# Patient Record
Sex: Female | Born: 1965 | Race: White | Hispanic: No | Smoking: Current every day smoker
Health system: Southern US, Academic
[De-identification: ages and names within clinical notes are randomized; demographics above are authoritative.]

## PROBLEM LIST (undated history)

## (undated) DIAGNOSIS — J449 Chronic obstructive pulmonary disease, unspecified: Secondary | ICD-10-CM

## (undated) DIAGNOSIS — F1910 Other psychoactive substance abuse, uncomplicated: Secondary | ICD-10-CM

## (undated) DIAGNOSIS — F1010 Alcohol abuse, uncomplicated: Secondary | ICD-10-CM

## (undated) DIAGNOSIS — K589 Irritable bowel syndrome without diarrhea: Secondary | ICD-10-CM

## (undated) HISTORY — PX: GASTROSCOPY WITH FEEDING TUBE PLACEMENT: WVUENDOPRO64

---

## 2021-07-07 ENCOUNTER — Encounter (HOSPITAL_COMMUNITY): Payer: Self-pay

## 2021-07-07 ENCOUNTER — Inpatient Hospital Stay (HOSPITAL_COMMUNITY)
Admission: EM | Admit: 2021-07-07 | Discharge: 2021-07-10 | DRG: 896 | Disposition: A | Discharge status: Home / Self Care | Payer: Medicaid Other | Attending: Internal Medicine | Admitting: Internal Medicine

## 2021-07-07 ENCOUNTER — Other Ambulatory Visit: Payer: Self-pay

## 2021-07-07 DIAGNOSIS — F17210 Nicotine dependence, cigarettes, uncomplicated: Secondary | ICD-10-CM | POA: Diagnosis present

## 2021-07-07 DIAGNOSIS — F1490 Cocaine use, unspecified, uncomplicated: Secondary | ICD-10-CM | POA: Diagnosis present

## 2021-07-07 DIAGNOSIS — F19129 Other psychoactive substance abuse with intoxication, unspecified: Secondary | ICD-10-CM | POA: Diagnosis present

## 2021-07-07 DIAGNOSIS — F1520 Other stimulant dependence, uncomplicated: Secondary | ICD-10-CM | POA: Diagnosis present

## 2021-07-07 DIAGNOSIS — F11129 Opioid abuse with intoxication, unspecified: Principal | ICD-10-CM | POA: Diagnosis present

## 2021-07-07 DIAGNOSIS — Z20822 Contact with and (suspected) exposure to covid-19: Secondary | ICD-10-CM | POA: Diagnosis present

## 2021-07-07 DIAGNOSIS — I700 Atherosclerosis of aorta: Secondary | ICD-10-CM | POA: Diagnosis present

## 2021-07-07 DIAGNOSIS — J449 Chronic obstructive pulmonary disease, unspecified: Secondary | ICD-10-CM | POA: Diagnosis present

## 2021-07-07 DIAGNOSIS — J9601 Acute respiratory failure with hypoxia: Secondary | ICD-10-CM | POA: Diagnosis present

## 2021-07-07 DIAGNOSIS — J690 Pneumonitis due to inhalation of food and vomit: Secondary | ICD-10-CM | POA: Diagnosis present

## 2021-07-07 DIAGNOSIS — T402X1A Poisoning by other opioids, accidental (unintentional), initial encounter: Secondary | ICD-10-CM | POA: Diagnosis present

## 2021-07-07 DIAGNOSIS — K589 Irritable bowel syndrome without diarrhea: Secondary | ICD-10-CM | POA: Diagnosis present

## 2021-07-07 DIAGNOSIS — T401X1A Poisoning by heroin, accidental (unintentional), initial encounter: Secondary | ICD-10-CM

## 2021-07-07 DIAGNOSIS — I959 Hypotension, unspecified: Secondary | ICD-10-CM | POA: Diagnosis present

## 2021-07-07 DIAGNOSIS — J9690 Respiratory failure, unspecified, unspecified whether with hypoxia or hypercapnia: Secondary | ICD-10-CM | POA: Diagnosis present

## 2021-07-07 HISTORY — DX: Chronic obstructive pulmonary disease, unspecified: J44.9

## 2021-07-07 HISTORY — DX: Other psychoactive substance abuse, uncomplicated: F19.10

## 2021-07-07 HISTORY — DX: Irritable bowel syndrome, unspecified: K58.9

## 2021-07-07 LAB — CBC WITH DIFFERENTIAL/PLATELET
Abs Immature Granulocytes: 0.2 10*3/uL — ABNORMAL HIGH (ref 0.00–0.07)
Basophils Absolute: 0 10*3/uL (ref 0.0–0.1)
Basophils Relative: 0 %
Eosinophils Absolute: 0.1 10*3/uL (ref 0.0–0.5)
Eosinophils Relative: 0 %
HCT: 41.9 % (ref 36.0–46.0)
Hemoglobin: 14.2 g/dL (ref 12.0–15.0)
Immature Granulocytes: 1 %
Lymphocytes Relative: 6 %
Lymphs Abs: 1.3 10*3/uL (ref 0.7–4.0)
MCH: 32.3 pg (ref 26.0–34.0)
MCHC: 33.9 g/dL (ref 30.0–36.0)
MCV: 95.4 fL (ref 80.0–100.0)
Monocytes Absolute: 1.2 10*3/uL — ABNORMAL HIGH (ref 0.1–1.0)
Monocytes Relative: 5 %
Neutro Abs: 19 10*3/uL — ABNORMAL HIGH (ref 1.7–7.7)
Neutrophils Relative %: 88 %
Platelets: 420 10*3/uL — ABNORMAL HIGH (ref 150–400)
RBC: 4.39 MIL/uL (ref 3.87–5.11)
RDW: 13.1 % (ref 11.5–15.5)
WBC: 21.8 10*3/uL — ABNORMAL HIGH (ref 4.0–10.5)
nRBC: 0 % (ref 0.0–0.2)

## 2021-07-07 LAB — COMPREHENSIVE METABOLIC PANEL
ALT: 19 U/L (ref 0–44)
AST: 29 U/L (ref 15–41)
Albumin: 3.3 g/dL — ABNORMAL LOW (ref 3.5–5.0)
Alkaline Phosphatase: 74 U/L (ref 38–126)
Anion gap: 10 (ref 5–15)
BUN: 5 mg/dL — ABNORMAL LOW (ref 6–20)
CO2: 23 mmol/L (ref 22–32)
Calcium: 7.8 mg/dL — ABNORMAL LOW (ref 8.9–10.3)
Chloride: 105 mmol/L (ref 98–111)
Creatinine, Ser: 0.8 mg/dL (ref 0.44–1.00)
GFR, Estimated: >60 mL/min (ref >60)
Glucose, Bld: 87 mg/dL (ref 70–99)
Potassium: 3.6 mmol/L (ref 3.5–5.1)
Sodium: 138 mmol/L (ref 135–145)
Total Bilirubin: 0.6 mg/dL (ref 0.3–1.2)
Total Protein: 6 g/dL — ABNORMAL LOW (ref 6.5–8.1)

## 2021-07-07 LAB — RAPID URINE DRUG SCREEN, HOSP PERFORMED
Amphetamines: POSITIVE — AB
Barbiturates: NOT DETECTED
Benzodiazepines: NOT DETECTED
Cocaine: NOT DETECTED
Opiates: POSITIVE — AB
Tetrahydrocannabinol: NOT DETECTED

## 2021-07-07 LAB — I-STAT BETA HCG BLOOD, ED (MC, WL, AP ONLY): I-stat hCG, quantitative: 5.6 m[IU]/mL — ABNORMAL HIGH (ref <5)

## 2021-07-07 LAB — ETHANOL: Alcohol, Ethyl (B): <10 mg/dL (ref <10)

## 2021-07-07 MED ORDER — NALOXONE HCL 0.4 MG/ML IJ SOLN
INTRAMUSCULAR | Status: AC
  Administered 2021-07-07: 0.2 mg via INTRAVENOUS
  Filled 2021-07-07: qty 1

## 2021-07-07 MED ORDER — NALOXONE HCL 0.4 MG/ML IJ SOLN: 0.2000 mg | Freq: Once | INTRAMUSCULAR | Status: AC

## 2021-07-07 MED ORDER — SODIUM CHLORIDE 0.9 % IV BOLUS
1000.0000 mL | Freq: Once | INTRAVENOUS | Status: AC
  Administered 2021-07-07: 1000 mL via INTRAVENOUS

## 2021-07-07 MED ORDER — NALOXONE HCL 4 MG/0.1ML NA LIQD
1.0000 | Freq: Once | NASAL | Status: AC
  Administered 2021-07-07: 1 via NASAL
  Filled 2021-07-07: qty 4

## 2021-07-07 NOTE — ED Provider Notes (Signed)
[facility] EMERGENCY DEPARTMENT Provider Note   CSN: [id number] Arrival date & time:        History  Chief Complaint  Patient presents with   Drug Overdose    Dasha Kawabata is a 56 y.o. female.  HPI Patient presents via EMS after being found unresponsive at a gas station.  Details obtained by EMS, and police who accompanied patient as well.  Seemingly the patient was found unresponsive, received Narcan, and in a brief period of lucidity acknowledge using heroin.  She became more somnolent in route requiring another dose of Narcan.  On this initial evaluation patient mumbles, but cannot provide any pertinent details of her history.  No report of fall, trauma, violence.  Level 5 caveat secondary to mental status change    Home Medications Prior to Admission medications   Not on File      Allergies    Patient has no known allergies.    Review of Systems   Review of Systems  Unable to perform ROS: Mental status change   Physical Exam Updated Vital Signs BP 122/87 (BP Location: Right Arm)    Pulse 94    Temp 97.7 F (36.5 C) (Axillary) Comment: unable to obtain oral temp at this time   Resp (!) 32    Ht 5' (1.524 m)    Wt 54.4 kg    SpO2 100%    BMI 23.44 kg/m  Physical Exam Vitals and nursing note reviewed.  Constitutional:      Appearance: She is well-developed. She is ill-appearing.     Comments: Thin adult female mumbling, writhing  HENT:     Head: Normocephalic and atraumatic.  Eyes:     Conjunctiva/sclera: Conjunctivae normal.  Cardiovascular:     Rate and Rhythm: Normal rate and regular rhythm.  Pulmonary:     Effort: Pulmonary effort is normal. No respiratory distress.     Breath sounds: Normal breath sounds. No stridor.  Abdominal:     General: There is no distension.  Skin:    General: Skin is warm and dry.  Neurological:     Cranial Nerves: No cranial nerve deficit.     Comments: Moves all extremity spontaneously, does not follow  commands, responds to stimuli, occasionally answers questions appropriately.  Psychiatric:        Mood and Affect: Affect is labile.    ED Results / Procedures / Treatments   Labs (all labs ordered are listed, but only abnormal results are displayed) Labs Reviewed - No data to display  EKG None  Radiology No results found.  Procedures Procedures    Medications Ordered in ED Medications - No data to display  ED Course/ Medical Decision Making/ A&P Consideration of altered mental status secondary to drugs, trauma, medication, electrolyte abnormalities.  Patient placed on continuous monitoring, pulse oximetry.  Cardiac monitor sinus tach, 100, 105 abnormal Pulse ox 99% with room air normal   3:43 PM Patient much more awake, continues to have nasal cannula, but nasal trumpet have been removed.  She is oriented to place, self, roughly to time at this point.                         Medical Decision Making Amount and/or Complexity of Data Reviewed Independent Historian: EMS External Data Reviewed: ECG.    Details: EMS rhythm strips, tachy [phone] Labs: ordered. Decision-making details documented in ED Course. ECG/medicine tests: independent interpretation performed. Decision-making details documented in  ED Course.  Risk Decision regarding hospitalization.  Critical Care Total time providing critical care: < 30 minutes  Adult female presents after apparent overdose.  No evidence for trauma.  Patient required Narcan x2 in the field, monitoring continuously here, had continued improvement, and eventually was awake, alert, able to describe her self as homeless, acknowledged using heroin, snorting, not injecting, no evidence for concurrent infection.  Leukocytosis likely reactive to her unresponsiveness.  Patient discharged with homelessness and substance abuse resources.        Final Clinical Impression(s) / ED Diagnoses Final diagnoses:  Accidental overdose of heroin,  initial encounter [facility])    Rx / DC Orders ED Discharge Orders     None         Gerhard Munch, MD 07/07/21 1656

## 2021-07-07 NOTE — ED Notes (Signed)
Help get patient undressed on the monitor did ekg shown to Dr Jeraldine Loots patient is resting with call bell in reach and nurse at bedside

## 2021-07-07 NOTE — ED Notes (Signed)
Attempted to ambulate pt to restroom. Pt unstable on feet and still requiring 2L [place]. Pt dropped to upper 80s RA. Pt stated "I feel weak". Pt currently on bedpan and has fluids and sandwich at bedside.

## 2021-07-07 NOTE — Discharge Instructions (Addendum)
It was our pleasure to provide your ER care today - we hope that you feel better.  Avoid heroin and/or other drug use. In event of accidental overdose, use narcan immediately as indicated.   Follow up with NA, and use resource guide for additional substance abuse treatment options in the area. Also see guide for social services/shelter.   Follow up with primary care doctor in the next 1-2 weeks.   For mental health issues and/or crisis, you may go directly to the [facility] - they are open 24/7 and walk-ins are welcome.   Return to ER if worse, new symptoms, fevers, chest pain, trouble breathing, or other concern.

## 2021-07-07 NOTE — ED Notes (Signed)
Notified Murphy PA about pt pupils decreasing from 3 to 2 and pt RR 6 and decreased responsiveness.

## 2021-07-07 NOTE — ED Provider Notes (Addendum)
Patient had been signed ot that patient had accidental overdose of heroin, and to d/c after period of ED observation.   Pt with episode of somnolence, decreased resp effort, miosis - narcan given, with rapid return to alert state.   Pt continued to be monitored closely in ED.    Multiple rechecks, pt resting and easily aroused, and/or awake/alert. No subsequent recurrent excessive drowsiness or somnolence, no resp depression.   Recheck pt, denies any new c/o. No headache. No chest pain or sob. Denies feeling depressed. Denies thoughts of self harm, no SI.    Po fluids/food. Ambulate in hall.   Pt encourage to pursue sobriety. Resource guide provided and guide for additional social services/shelters provided. Narcan nasal kit also provided.   Rec pcp and SA tx f/u.  Ambulates in hall to bathroom. Remains fully awake and alert.   Patient current appears stable for d/c.   Return precautions provided.        Lajean Saver, MD 07/07/21 2314

## 2021-07-07 NOTE — ED Notes (Signed)
Pt RR 13 and Alert to speech and pupils 4 and brisk bilaterally

## 2021-07-07 NOTE — ED Triage Notes (Signed)
Pt arrived to ED via EMS from gas station where pt was found unresponsive after a witnessed IV heroin use. Fire gave 1mg  narcan and EMS gave 0.5 mg narcan d/t decreased LOC en route. Pt ventilations were assisted by Fire and EMS. EMS switched pt to NRB. 20g L wrist. VS: BP initially 88/palp, after 367mL fluids, BP was 106/70, HR 104 ST, O2 100% on NRB.

## 2021-07-08 ENCOUNTER — Emergency Department (HOSPITAL_COMMUNITY): Payer: Medicaid Other

## 2021-07-08 DIAGNOSIS — T402X1A Poisoning by other opioids, accidental (unintentional), initial encounter: Secondary | ICD-10-CM | POA: Diagnosis present

## 2021-07-08 DIAGNOSIS — J9690 Respiratory failure, unspecified, unspecified whether with hypoxia or hypercapnia: Secondary | ICD-10-CM | POA: Diagnosis present

## 2021-07-08 DIAGNOSIS — J9601 Acute respiratory failure with hypoxia: Secondary | ICD-10-CM | POA: Diagnosis present

## 2021-07-08 DIAGNOSIS — F1520 Other stimulant dependence, uncomplicated: Secondary | ICD-10-CM | POA: Diagnosis present

## 2021-07-08 LAB — CBC WITH DIFFERENTIAL/PLATELET
Abs Immature Granulocytes: 0.04 10*3/uL (ref 0.00–0.07)
Basophils Absolute: 0 10*3/uL (ref 0.0–0.1)
Basophils Relative: 0 %
Eosinophils Absolute: 0.3 10*3/uL (ref 0.0–0.5)
Eosinophils Relative: 2 %
HCT: 38 % (ref 36.0–46.0)
Hemoglobin: 12.6 g/dL (ref 12.0–15.0)
Immature Granulocytes: 0 %
Lymphocytes Relative: 24 %
Lymphs Abs: 2.7 10*3/uL (ref 0.7–4.0)
MCH: 32.5 pg (ref 26.0–34.0)
MCHC: 33.2 g/dL (ref 30.0–36.0)
MCV: 97.9 fL (ref 80.0–100.0)
Monocytes Absolute: 0.7 10*3/uL (ref 0.1–1.0)
Monocytes Relative: 7 %
Neutro Abs: 7.4 10*3/uL (ref 1.7–7.7)
Neutrophils Relative %: 67 %
Platelets: 378 10*3/uL (ref 150–400)
RBC: 3.88 MIL/uL (ref 3.87–5.11)
RDW: 13.2 % (ref 11.5–15.5)
WBC: 11.1 10*3/uL — ABNORMAL HIGH (ref 4.0–10.5)
nRBC: 0 % (ref 0.0–0.2)

## 2021-07-08 LAB — URINALYSIS, ROUTINE W REFLEX MICROSCOPIC
Bilirubin Urine: NEGATIVE
Glucose, UA: 150 mg/dL — AB
Ketones, ur: NEGATIVE mg/dL
Nitrite: NEGATIVE
Protein, ur: 30 mg/dL — AB
Specific Gravity, Urine: 1.015 (ref 1.005–1.030)
WBC, UA: >50 WBC/hpf — ABNORMAL HIGH (ref 0–5)
pH: 5 (ref 5.0–8.0)

## 2021-07-08 LAB — RESP PANEL BY RT-PCR (FLU A&B, COVID) ARPGX2
Influenza A by PCR: NEGATIVE
Influenza B by PCR: NEGATIVE
SARS Coronavirus 2 by RT PCR: NEGATIVE

## 2021-07-08 LAB — HIV ANTIBODY (ROUTINE TESTING W REFLEX): HIV Screen 4th Generation wRfx: NONREACTIVE

## 2021-07-08 LAB — COMPREHENSIVE METABOLIC PANEL
ALT: 15 U/L (ref 0–44)
AST: 22 U/L (ref 15–41)
Albumin: 3 g/dL — ABNORMAL LOW (ref 3.5–5.0)
Alkaline Phosphatase: 77 U/L (ref 38–126)
Anion gap: 4 — ABNORMAL LOW (ref 5–15)
BUN: 7 mg/dL (ref 6–20)
CO2: 27 mmol/L (ref 22–32)
Calcium: 7.9 mg/dL — ABNORMAL LOW (ref 8.9–10.3)
Chloride: 104 mmol/L (ref 98–111)
Creatinine, Ser: 0.73 mg/dL (ref 0.44–1.00)
GFR, Estimated: >60 mL/min (ref >60)
Glucose, Bld: 98 mg/dL (ref 70–99)
Potassium: 3.5 mmol/L (ref 3.5–5.1)
Sodium: 135 mmol/L (ref 135–145)
Total Bilirubin: 0.1 mg/dL — ABNORMAL LOW (ref 0.3–1.2)
Total Protein: 5.6 g/dL — ABNORMAL LOW (ref 6.5–8.1)

## 2021-07-08 LAB — PHOSPHORUS: Phosphorus: 3 mg/dL (ref 2.5–4.6)

## 2021-07-08 LAB — LACTIC ACID, PLASMA: Lactic Acid, Venous: 1.3 mmol/L (ref 0.5–1.9)

## 2021-07-08 LAB — MAGNESIUM: Magnesium: 1.9 mg/dL (ref 1.7–2.4)

## 2021-07-08 MED ORDER — SODIUM CHLORIDE 0.9 % IV SOLN: INTRAVENOUS | Status: AC

## 2021-07-08 MED ORDER — POLYETHYLENE GLYCOL 3350 17 G PO PACK: 17.0000 g | PACK | Freq: Every day | ORAL | Status: DC | PRN

## 2021-07-08 MED ORDER — ENOXAPARIN SODIUM 40 MG/0.4ML IJ SOSY
40.0000 mg | PREFILLED_SYRINGE | INTRAMUSCULAR | Status: DC
  Administered 2021-07-08 – 2021-07-10 (×3): 40 mg via SUBCUTANEOUS
  Filled 2021-07-08 (×3): qty 0.4

## 2021-07-08 MED ORDER — SODIUM CHLORIDE 0.9% FLUSH
3.0000 mL | Freq: Two times a day (BID) | INTRAVENOUS | Status: DC
  Administered 2021-07-08 – 2021-07-10 (×4): 3 mL via INTRAVENOUS

## 2021-07-08 MED ORDER — SODIUM CHLORIDE 0.9 % IV BOLUS
1000.0000 mL | Freq: Once | INTRAVENOUS | Status: AC
  Administered 2021-07-08: 1000 mL via INTRAVENOUS

## 2021-07-08 MED ORDER — ACETAMINOPHEN 650 MG RE SUPP: 650.0000 mg | Freq: Four times a day (QID) | RECTAL | Status: DC | PRN

## 2021-07-08 MED ORDER — CEFTRIAXONE SODIUM 2 G IJ SOLR
2.0000 g | Freq: Once | INTRAMUSCULAR | Status: AC
Start: 2021-07-08 — End: 2021-07-08
  Administered 2021-07-08: 2 g via INTRAVENOUS
  Filled 2021-07-08: qty 20

## 2021-07-08 MED ORDER — ACETAMINOPHEN 325 MG PO TABS: 650.0000 mg | ORAL_TABLET | Freq: Four times a day (QID) | ORAL | Status: DC | PRN

## 2021-07-08 MED ORDER — SODIUM CHLORIDE 0.9 % IV SOLN
500.0000 mg | Freq: Once | INTRAVENOUS | Status: AC
  Administered 2021-07-08: 500 mg via INTRAVENOUS
  Filled 2021-07-08: qty 5

## 2021-07-08 NOTE — ED Notes (Signed)
Pt was ambulated to bathroom w/o O2 unbeknownst to this RN until after the fact. Assisted pt back to bed and pt was 87% RA and c/o shob. Pt hooked up to 2L O2 via [place] and sats increased to 98%. Pt also reporting dizziness when ambulating. Instructed pt to not let anyone ambulate her to the bathroom d/t shob, O2 requirement and dizziness, and low BP.

## 2021-07-08 NOTE — Sepsis Progress Note (Signed)
eLink is following this Code Sepsis. °

## 2021-07-08 NOTE — ED Notes (Signed)
Paged admitting regarding urinary retention of per bladder scan. Pt has been attempting to void w/o much success. Awaiting response from MD

## 2021-07-08 NOTE — ED Notes (Signed)
Pt more alert than yesterday. Pt is still disoriented to situation. Pt was oriented to time and person initially yesterday. She can now tell me she is in the hospital. Pt remains hypotensive, however pt reports her baseline SBP is in the 90's. Pt still requiring 2L O2 which is not her baseline. Notifying MD.

## 2021-07-08 NOTE — ED Notes (Signed)
IV infiltrated. Paused infusions currently running to place another IV

## 2021-07-08 NOTE — ED Notes (Signed)
This RN attempted to wean pt off of O2, sats began to drop in mid 80s. This RN placed pt back on 2L. This RN notified charge RN that this pt was not appropriate to be discharged at this time.

## 2021-07-08 NOTE — ED Notes (Signed)
Bladder scan shows 

## 2021-07-08 NOTE — H&P (Signed)
Date: 07/08/2021     Patient Name:  Lauren Blackwell MRN: [id number]  DOB: 12-04-1965 Age / Sex: 56 y.o., female   PCP: Pcp, No         Medical Service: Internal Medicine Teaching Service       Attending Physician: Dr. Mikey Bussing, Marthenia Rolling, DO    Intern (1st Contact): Elza Rafter, DO Pager: RA [phone]  Resident (2nd Contact): Salena Saner, MD Pager: PB [phone]       After Hours (After 5p/  First Contact Pager: [phone]  weekends / holidays): Second Contact Pager: [phone]   SUBJECTIVE:  Chief Complaint: Respiratory distress  History of Present Illness: Lauren Blackwell is a functionally independent 56 y.o. female with a pertinent PMH of polysubstance use disorder, who presents to [facility] with AMS and shortness of breath.  Patient presents with altered mental status secondary to drug intoxication that improved with Narcan.  During this time patient had developed some shortness of breath with mild desaturations with SPO2 into the low 90s.  Patient endorsed IV drug use without any signs or symptoms of systemic infection.  She denies any fevers, chills,'s shortness of breath, chest pain, open wounds, sick contacts.  Her last use was yesterday and endorsed IV heroin use although she prefers methamphetamines on the weekly basis.   Medications: No current facility-administered medications on file prior to encounter.   Current Outpatient Medications on File Prior to Encounter  Medication Sig Dispense Refill   acetaminophen (TYLENOL) 500 MG tablet Take 1,000 mg by mouth every 6 (six) hours as needed for moderate pain or headache.      Past Medical History: Past Medical History:  Diagnosis Date   COPD (chronic obstructive pulmonary disease) (HCC)    IBS (irritable bowel syndrome)    IV drug abuse [facility])    Heroin    Social:  Lives - [address] [place],  [place] [address],   Support -unknown Level of function: Independent  Occupation -unknown PCP - Pcp, No,  Substance use: Illicit -  amphetamines and other , heroin ETOH -  unknown Tobacco -yes  Family History: History reviewed. No pertinent family history.  Allergies: Allergies as of 07/07/2021   (No Known Allergies)    Review of Systems: A complete ROS was negative except as per HPI.   OBJECTIVE:  Physical Exam: Blood pressure 93/67, pulse 83, temperature 97.7 F (36.5 C), temperature source Axillary, resp. rate 10, height 5' (1.524 m), weight 54.4 kg, SpO2 91 %. Physical Exam Constitutional:      Appearance: Normal appearance. She is underweight. She is ill-appearing (chronically ill appearing).  HENT:     Head: Normocephalic and atraumatic.     Mouth/Throat:     Comments: Poor dentition  Eyes:     Extraocular Movements: Extraocular movements intact.  Cardiovascular:     Rate and Rhythm: Normal rate.     Pulses: Normal pulses.     Heart sounds: Normal heart sounds.  Pulmonary:     Effort: Pulmonary effort is normal.     Breath sounds: Normal breath sounds.  Abdominal:     General: Bowel sounds are normal.     Palpations: Abdomen is soft.     Tenderness: There is no abdominal tenderness.  Musculoskeletal:        General: Normal range of motion.     Cervical back: Normal range of motion.     Right lower leg: No edema.     Left lower leg: No edema.  Skin:  General: Skin is warm and dry.  Neurological:     Mental Status: She is alert and oriented to person, place, and time. Mental status is at baseline.  Psychiatric:        Mood and Affect: Mood normal.    Pertinent Labs: CBC    Component Value Date/Time   WBC 11.1 (H) 07/08/2021 1324   RBC 3.88 07/08/2021 1324   HGB 12.6 07/08/2021 1324   HCT 38.0 07/08/2021 1324   PLT 378 07/08/2021 1324   MCV 97.9 07/08/2021 1324   MCH 32.5 07/08/2021 1324   MCHC 33.2 07/08/2021 1324   RDW 13.2 07/08/2021 1324   LYMPHSABS 2.7 07/08/2021 1324   MONOABS 0.7 07/08/2021 1324   EOSABS 0.3 07/08/2021 1324   BASOSABS 0.0 07/08/2021 1324      CMP     Component Value Date/Time   NA 135 07/08/2021 1324   K 3.5 07/08/2021 1324   CL 104 07/08/2021 1324   CO2 27 07/08/2021 1324   GLUCOSE 98 07/08/2021 1324   BUN 7 07/08/2021 1324   CREATININE 0.73 07/08/2021 1324   CALCIUM 7.9 (L) 07/08/2021 1324   PROT 5.6 (L) 07/08/2021 1324   ALBUMIN 3.0 (L) 07/08/2021 1324   AST 22 07/08/2021 1324   ALT 15 07/08/2021 1324   ALKPHOS 77 07/08/2021 1324   BILITOT 0.1 (L) 07/08/2021 1324   GFRNONAA >60 07/08/2021 1324    Pertinent Imaging: DG Chest Port 1 View  Result Date: 07/08/2021 CLINICAL DATA:  Found unresponsive at gas station following witnessed IV heroin use, history COPD, smoker EXAM: PORTABLE CHEST 1 VIEW COMPARISON:  Portable exam 0812 hours without priors for comparison FINDINGS: Normal heart size, mediastinal contours, and pulmonary vascularity. Atherosclerotic calcification aorta. Bibasilar atelectasis. No definite infiltrate, pleural effusion, or pneumothorax. Multiple old RIGHT rib fractures without acute osseous findings. IMPRESSION: Bibasilar atelectasis. Aortic Atherosclerosis (ICD10-I70.0). Electronically Signed   By: Ulyses Southward M.D.   On: 07/08/2021 08:21    EKG: sinus tachycardia  ASSESSMENT & PLAN:  Patient Summary: Lauren Blackwell is a functionally independent 56 y.o. female with a pertinent PMH of polysubstance use disorder, who presents to [facility] with AMS and shortness of breath.and admit for acute heroine intoxication and likely aspiration.   Assessment: Principal Problem:   Respiratory failure (HCC)  Plan: #Heroin intoxication Patient has a history of IV drug use who presented with acute heroin intoxication that resolved with Narcan.  Patient states she does not usually use opioid medications.  She has no significant systemic signs of infection.  Due to her history we will empirically treat for infection today and tomorrow. -Blood cultures -Broad-spectrum antibiotics -Likely discharge  tomorrow  #Aspiration Patient likely developed mild respiratory distress with aspiration after acute heroin intoxication.  X-ray shows some bibasilar atelectasis without signs of infection. -She is currently off supplemental oxygen at this time.  Best Practice: Diet: Regular diet IVF: D5-0.9NS,  VTE: enoxaparin (LOVENOX) injection 40 mg Start: 07/08/21 1145 Code: Full Code Dispo: Observation with expected length of stay less than 2 midnights. Anticipated Discharge Location: Home Barriers to Discharge: IV antibiotics Family Contact: none  Signature: Chari Manning, D.O.  Internal Medicine Resident, PGY-3 [facility]  Pager: [phone] 4:11 PM, 07/08/2021   Please contact the on call pager after 5 pm and on weekends at [phone].

## 2021-07-08 NOTE — ED Provider Notes (Signed)
I assumed care of the patient at 0 800.  Briefly the patient had presented after a heroin overdose.  She was observed in the emergency department and deemed okay for discharge.  Nursing had noted that she was hypoxic and persistently confused and so she was kept in her room overnight.  She does endorse having some difficulty breathing.  Has been coughing and tells me she has been coughing up some gross sputum.  She was also noted to be hypotensive.  We will give a bolus of IV fluids.  I suspect the most likely diagnosis post heroin overdose with hypoxia would be aspiration pneumonia.  As the patient is hypotensive and hypoxic we will initiate code sepsis.  Started on antibiotics.  Blood cultures.  I do not hear any obvious murmur on my exam.  She does have faint right lower lobe rhonchi compared to the left consistent with aspiration.  Chest x-ray viewed by me without obvious signs of consolidation.  Patient reassessed and blood pressures come up a little bit with fluids.  Maps in the 70s.  Will discuss with medicine for admission.  Sepsis - Repeat Assessment  Performed at:    1035  Vitals     Blood pressure 93/65, pulse 69, temperature 97.7 F (36.5 C), temperature source Axillary, resp. rate 15, height 5' (1.524 m), weight 54.4 kg, SpO2 93 %.  Heart:     Regular rate and rhythm  Lungs:    Rhonchi  Capillary Refill:   <2 sec  Peripheral Pulse:   Radial pulse palpable  Skin:     Normal Color     CRITICAL CARE Performed by: Rae Roam   Total critical care time: 35 minutes  Critical care time was exclusive of separately billable procedures and treating other patients.  Critical care was necessary to treat or prevent imminent or life-threatening deterioration.  Critical care was time spent personally by me on the following activities: development of treatment plan with patient and/or surrogate as well as nursing, discussions with consultants, evaluation of patient's  response to treatment, examination of patient, obtaining history from patient or surrogate, ordering and performing treatments and interventions, ordering and review of laboratory studies, ordering and review of radiographic studies, pulse oximetry and re-evaluation of patient's condition.    Melene Plan, DO 07/08/21 1035

## 2021-07-08 NOTE — ED Notes (Signed)
Called lab to have them draw labs and cultures.

## 2021-07-08 NOTE — ED Notes (Signed)
Attempted to wean pt off of O2, O2 sats 84% RA, this RN placed pt back 2L [place].

## 2021-07-08 NOTE — ED Notes (Signed)
Paged attending DO regarding need for foley order d/t urinary retention w/ bladder scan volume of . Still awaiting response.

## 2021-07-08 NOTE — ED Notes (Signed)
Ordered breakfast tray for pt  

## 2021-07-08 NOTE — ED Notes (Signed)
Per MD I&O cath x 1 and then see if pt can void on her own, then bladder scan again in a few hours after I&O cath. If still retaining, contact MD back. Expressed concern for this placing pt at a higher risk for infection d/t multiple straight caths and pt being worked up for sepsis and hx of IV drug use. MD verbalized awareness of the increased risk of infection.

## 2021-07-08 NOTE — ED Notes (Signed)
Assisted patient with peri-care; provided new gown and clean linens prior to transport to 5W.

## 2021-07-08 NOTE — ED Notes (Signed)
Pt ambulated to bathroom x1 assist. This RN reassessed VS once back in bed. While attempting to wean pt off of O2, pt's sats 80% RA. Pt placed back on 2L [place]. EDP notified about pt's O2 needs.

## 2021-07-09 DIAGNOSIS — F1510 Other stimulant abuse, uncomplicated: Secondary | ICD-10-CM

## 2021-07-09 DIAGNOSIS — J9601 Acute respiratory failure with hypoxia: Secondary | ICD-10-CM

## 2021-07-09 DIAGNOSIS — F17210 Nicotine dependence, cigarettes, uncomplicated: Secondary | ICD-10-CM

## 2021-07-09 LAB — URINE CULTURE

## 2021-07-09 LAB — HEPATITIS C ANTIBODY: HCV Ab: NONREACTIVE

## 2021-07-09 MED ORDER — IPRATROPIUM-ALBUTEROL 0.5-2.5 (3) MG/3ML IN SOLN: 3.0000 mL | RESPIRATORY_TRACT | Status: DC | PRN

## 2021-07-09 MED ORDER — IPRATROPIUM-ALBUTEROL 0.5-2.5 (3) MG/3ML IN SOLN
3.0000 mL | RESPIRATORY_TRACT | Status: DC
  Administered 2021-07-09: 3 mL via RESPIRATORY_TRACT
  Filled 2021-07-09 (×3): qty 3

## 2021-07-09 MED ORDER — AMOXICILLIN-POT CLAVULANATE 875-125 MG PO TABS
1.0000 | ORAL_TABLET | Freq: Two times a day (BID) | ORAL | Status: DC
  Administered 2021-07-09 – 2021-07-10 (×3): 1 via ORAL
  Filled 2021-07-09: qty 7
  Filled 2021-07-09 (×3): qty 1

## 2021-07-09 MED ORDER — NICOTINE 14 MG/24HR TD PT24
14.0000 mg | MEDICATED_PATCH | Freq: Every day | TRANSDERMAL | Status: DC
  Administered 2021-07-09 – 2021-07-10 (×2): 14 mg via TRANSDERMAL
  Filled 2021-07-09 (×2): qty 1

## 2021-07-09 NOTE — Plan of Care (Signed)

## 2021-07-09 NOTE — Progress Notes (Addendum)
Lauren Blackwell is a un-homed 56 y.o. female with a pertinent PMH of polysubstance use disorder, COPD who presented to [facility] with AMS and shortness of breath and admit for acute heroine intoxication and likely aspiration pneumonia with initial CBC showing leukocytosis with neutrophil predominance.  Subjective:  Overnight:NAEON  Patient stated she was doing well except for a headache. She thought the headache was coming from the oxygen. She stated she does not have a place to live after leaving the hospital. Assured patient we will get someone who will assist the patient with this.    Objective:  Vital signs in last 24 hours: afebrile with one temp at 99.4, normotensive, desaturation to 80s and placed on 3 L. Vitals:   07/09/21 0345 07/09/21 0455 07/09/21 0529 07/09/21 0754  BP: 105/75   (!) 117/91  Pulse: 97   (!) 111  Resp: 14   19  Temp: 98.3 F (36.8 C)   98.6 F (37 C)  TempSrc: Axillary   Oral  SpO2: 91% (!) 80% (!) 83% 96%  Weight:      Height:       Supplemental O2: 3 L SpO2: 96 % O2 Flow Rate (L/min): 3 L/min  Filed Weights   07/07/21 1430 07/09/21 0008  Weight: 54.4 kg 51.5 kg     Intake/Output Summary (Last 24 hours) at 07/09/2021 0858 Last data filed at 07/09/2021 0536 Gross per 24 hour  Intake 2844.75 ml  Output 1450 ml  Net 1394.75 ml   Net IO Since Admission: 2,695.27 mL [07/09/21 0858]  Physical Exam General: NAD CV: normal heart sounds, 2+ radial pulses Pulm: Normal work of breathing, chest clear to auscultation of left lung and decreased movement on the right lower lung. No wheeze heard.  MSK: Normal muscle bulk and tone.  Neuro: Alert and oriented x4.  Psych: Normal mood and normal affect.   Diagnostics: CBC Latest Ref Rng & Units 07/08/2021 07/07/2021  WBC 4.0 - 10.5 K/uL 11.1(H) 21.8(H)  Hemoglobin 12.0 - 15.0 g/dL 12.6 14.2  Hematocrit 36.0 - 46.0 % 38.0 41.9  Platelets 150 - 400 K/uL 378 420(H)    BMP Latest Ref Rng & Units 07/08/2021 07/07/2021   Glucose 70 - 99 mg/dL 98 87  BUN 6 - 20 mg/dL 7 5(L)  Creatinine 0.44 - 1.00 mg/dL 0.73 0.80  Sodium 135 - 145 mmol/L 135 138  Potassium 3.5 - 5.1 mmol/L 3.5 3.6  Chloride 98 - 111 mmol/L 104 105  CO2 22 - 32 mmol/L 27 23  Calcium 8.9 - 10.3 mg/dL 7.9(L) 7.8(L)    Assessment/Plan: Lauren Blackwell is a un-homed 56 y.o. female with a pertinent PMH of polysubstance use disorder, COPD who presented to [facility] with AMS and shortness of breath and admit for acute heroine intoxication and likely aspiration pneumonia with initial CBC showing leukocytosis with neutrophil predominance.   Principal Problem:   Opioid overdose ([place]) Active Problems:   Acute hypoxemic respiratory failure (HCC)   Methamphetamine use disorder, severe, dependence (HCC)   Respiratory failure ([place])  Aspiration Pneumonia Acute hypoxic respiratory failure Patient is s/p Azithromycin and Rocephin in the ED. Leukocytosis count improved with administration of these abx. Patient satting in the 80s overnight and placed on 3 L. Given hypoxia, CXR findings and leukocytosis, aspiration pneumonia appears as likely cause. Will switch to Augmentin 875-125 BID for 4 days to complete 5 day abx course. Blood cx are NGTD. -Augmentin as above (day 2/5) -Supplemental oxygen as needed for oxygen saturation between 88-92 %.  Wean as tolerated. -Duo-nebs q 4hrs   Heroin Intoxication (resolved) Methamphetamine use disorder UDS positive for opioids and amphetamines. Patient is s/p narcan with resolution of her lethargy and alteration of mental status. Patient endorsed long term hx with of cocaine and amphetamine use but stated does not use opiates regularly. Endorsed desire to quit and when offered a treatment center as a potential option she stated she would like that.  -TOC consult for assistance with substance abuse resources and housing assistance -CTM for withdrawal symptoms  COPD Patient states she has been told she has COPD previously and  was placed on 2 inhalers previously. She endorsed 20 pack year smoking hx. No wheezing was appreciated. She did have poor air movement in the right lung which appears to be 2/2 to her aspiration pneumonia. No recent record of inhaler use or PFTs. So will use duo-nebs while in the hospital and plan for further evaluation on outpatient setting.  -Will start duo-nebs q4hrs  -Outpatient PFTs to restart inhalers if needed  Tobacco use disorder 20 pack smoking hx. Counseled on smoking cessation. -Nicotine patch daily  Diet: Regular diet IVF: None VTE: enoxaparin (LOVENOX) injection 40 mg Code: Full Code Dispo: Observation with expected length of stay less than 2 midnights. Anticipated Discharge Location: Home Barriers to Discharge: IV antibiotics Family Contact: none  Dispo: Anticipated discharge in approximately 1 day(s).   Lauren Schuller, MD Tillie Rung. [facility] Internal Medicine Residency, PGY-1  Pager: [phone] After 5 pm and on weekends: Please call the on-call pager

## 2021-07-09 NOTE — Progress Notes (Signed)
Pt received from ED in stable condition. Pt A/Ox4, able to follow commands but drowsy. Pt oriented to room, CHG bath and skin assessment completed.

## 2021-07-09 NOTE — Progress Notes (Signed)
SATURATION QUALIFICATIONS: (This note is used to comply with regulatory documentation for home oxygen)  Patient Saturations on Room Air at Rest = 86%  Patient Saturations on Room Air while Ambulating = 82%  Patient Saturations on 3 Liters of oxygen while Ambulating = 90%  Please briefly explain why patient needs home oxygen: 

## 2021-07-10 ENCOUNTER — Encounter (HOSPITAL_COMMUNITY): Payer: Self-pay

## 2021-07-10 ENCOUNTER — Emergency Department (HOSPITAL_COMMUNITY)
Admission: EM | Admit: 2021-07-10 | Discharge: 2021-07-11 | Disposition: A | Discharge status: Home / Self Care | Payer: Medicaid Other | Attending: Emergency Medicine | Admitting: Emergency Medicine

## 2021-07-10 DIAGNOSIS — J440 Chronic obstructive pulmonary disease with acute lower respiratory infection: Secondary | ICD-10-CM

## 2021-07-10 DIAGNOSIS — R0602 Shortness of breath: Secondary | ICD-10-CM | POA: Insufficient documentation

## 2021-07-10 DIAGNOSIS — I700 Atherosclerosis of aorta: Secondary | ICD-10-CM

## 2021-07-10 DIAGNOSIS — T402X1A Poisoning by other opioids, accidental (unintentional), initial encounter: Secondary | ICD-10-CM

## 2021-07-10 DIAGNOSIS — R059 Cough, unspecified: Secondary | ICD-10-CM | POA: Insufficient documentation

## 2021-07-10 DIAGNOSIS — J690 Pneumonitis due to inhalation of food and vomit: Secondary | ICD-10-CM

## 2021-07-10 DIAGNOSIS — J449 Chronic obstructive pulmonary disease, unspecified: Secondary | ICD-10-CM | POA: Insufficient documentation

## 2021-07-10 LAB — BASIC METABOLIC PANEL
Anion gap: 6 (ref 5–15)
BUN: 8 mg/dL (ref 6–20)
CO2: 27 mmol/L (ref 22–32)
Calcium: 8.4 mg/dL — ABNORMAL LOW (ref 8.9–10.3)
Chloride: 105 mmol/L (ref 98–111)
Creatinine, Ser: 0.67 mg/dL (ref 0.44–1.00)
GFR, Estimated: >60 mL/min (ref >60)
Glucose, Bld: 117 mg/dL — ABNORMAL HIGH (ref 70–99)
Potassium: 3.4 mmol/L — ABNORMAL LOW (ref 3.5–5.1)
Sodium: 138 mmol/L (ref 135–145)

## 2021-07-10 LAB — CBC WITH DIFFERENTIAL/PLATELET
Abs Immature Granulocytes: 0.02 10*3/uL (ref 0.00–0.07)
Basophils Absolute: 0 10*3/uL (ref 0.0–0.1)
Basophils Relative: 0 %
Eosinophils Absolute: 0.2 10*3/uL (ref 0.0–0.5)
Eosinophils Relative: 2 %
HCT: 38.2 % (ref 36.0–46.0)
Hemoglobin: 12.8 g/dL (ref 12.0–15.0)
Immature Granulocytes: 0 %
Lymphocytes Relative: 27 %
Lymphs Abs: 2.4 10*3/uL (ref 0.7–4.0)
MCH: 32 pg (ref 26.0–34.0)
MCHC: 33.5 g/dL (ref 30.0–36.0)
MCV: 95.5 fL (ref 80.0–100.0)
Monocytes Absolute: 0.6 10*3/uL (ref 0.1–1.0)
Monocytes Relative: 7 %
Neutro Abs: 5.5 10*3/uL (ref 1.7–7.7)
Neutrophils Relative %: 64 %
Platelets: 400 10*3/uL (ref 150–400)
RBC: 4 MIL/uL (ref 3.87–5.11)
RDW: 12.8 % (ref 11.5–15.5)
WBC: 8.8 10*3/uL (ref 4.0–10.5)
nRBC: 0 % (ref 0.0–0.2)

## 2021-07-10 MED ORDER — AMOXICILLIN-POT CLAVULANATE 875-125 MG PO TABS: 1.0000 | ORAL_TABLET | Freq: Two times a day (BID) | ORAL | 0 refills | Status: DC

## 2021-07-10 MED ORDER — AMOXICILLIN-POT CLAVULANATE 875-125 MG PO TABS: 1.0000 | ORAL_TABLET | Freq: Two times a day (BID) | ORAL | 0 refills | Status: AC

## 2021-07-10 NOTE — Discharge Summary (Signed)
Name: Lauren Blackwell MRN: [id number] DOB: 1965-07-13 56 y.o. PCP: Pcp, No  Date of Admission: 07/07/2021  2:08 PM Date of Discharge: 07/10/2021 Attending Physician: Lucious Groves, DO  Discharge Diagnosis: 1. Heroin intoxication in setting of polysubstance use disorder 2. Aspiration pneumonia 3. COPD 4. Aortic atherosclerosis  Discharge Medications: Allergies as of 07/10/2021   No Known Allergies      Medication List     TAKE these medications    acetaminophen 500 MG tablet Commonly known as: TYLENOL Take 1,000 mg by mouth every 6 (six) hours as needed for moderate pain or headache.   amoxicillin-clavulanate 875-125 MG tablet Commonly known as: AUGMENTIN Take 1 tablet by mouth every 12 (twelve) hours for 3 days.       Disposition and follow-up:   Ms.Trinidee Shirk was discharged from [facility] in Stable condition.  At the hospital follow up visit please address:  1. Heroin intoxication in setting of polysubstance use disorder: Social work during hospitalization gave pt resources. If continues to use opioids would recommend suboxone clinic.  2. Aspiration pneumonia: Patient to complete 5 days of antibiotics, last day 07/12/2021.  3. COPD: Would benefit from PFT's, possibly maintenance inhaler  4. Aortic atherosclerosis: Would benefit from risk factor modification work-up.  5.  Labs / imaging needed at time of follow-up: CBC, BMP  6.  Pending labs/ test needing follow-up: n/a  Follow-up Appointments:  Follow-up Information     [facility].   Contact information: [place] [phone] [place].   Specialty: Urgent Care Contact information: [place] [facility] Course by problem list: 1. Heroin intoxication in setting of polysubstance use disorder Patient arrived to  ED via EMS after being found unresponsive following witnessed IV heroin use. She reports she thought the substance was methamphetamines. Patient required two doses of Narcan. Initially patient remained somnolent but eventually became awake and alert. Patient did not show signs of opioid withdrawal during admission. We engaged CSW and TOC early in the admission to assist with transportation, housing, and PCP needs.  2. Aspiration pneumonia While in the ED, patient was persistently hypoxic to 80's with mild hypotension and productive cough. She was initiated on IV antibiotics and IMTS was consulted for admission. Given clinical picture and chest x-ray findings presentation most consistent with aspiration pneumonia. Patient was able to quickly wean off supplemental oxygen and was transitioned over to oral antibiotics on day 1. She was hemodynamically stable and sating well on room air the next day. She was then discharged on Augmentin with instructions to complete a total of 5 day course.   Discharge Exam:   BP 121/77 (BP Location: Left Arm)    Pulse 83    Temp 97.9 F (36.6 C) (Oral)    Resp 16    Ht 5' (1.524 m)    Wt 51.5 kg    SpO2 92%    BMI 22.17 kg/m  Discharge exam:  General: Resting comfortably in bed, no acute distress CV: Regular rate, rhythm. No murmurs appreciated Pulm: Normal work of breathing on room air. Clear to ausculation bilaterally GI: Abdomen soft, non-tender, non-distended. Normoactive bowel sounds MSK: Normal bulk, tone. No pitting edema bilateral lower extremities Neuro: Awake, alert, conversing appropriately Psych: Normal mood, affect, speech  Pertinent Labs,  Studies, and Procedures:  CBC Latest Ref Rng & Units 07/08/2021 07/07/2021  WBC 4.0 - 10.5 K/uL 11.1(H) 21.8(H)  Hemoglobin 12.0 - 15.0 g/dL 12.6 14.2  Hematocrit 36.0 - 46.0 % 38.0 41.9  Platelets 150 - 400 K/uL 378 420(H)   BMP Latest Ref Rng & Units 07/08/2021 07/07/2021  Glucose 70 - 99 mg/dL 98 87  BUN 6 - 20  mg/dL 7 5(L)  Creatinine 0.44 - 1.00 mg/dL 0.73 0.80  Sodium 135 - 145 mmol/L 135 138  Potassium 3.5 - 5.1 mmol/L 3.5 3.6  Chloride 98 - 111 mmol/L 104 105  CO2 22 - 32 mmol/L 27 23  Calcium 8.9 - 10.3 mg/dL 7.9(L) 7.8(L)   CXR 07/08/2021: Bibasilar atelectasis. Aortic atherosclerosis.  Discharge Instructions: Ms. Glacken,  I am glad you are feeling better and can be discharged today! You were admitted due to opioid overdose and small aspiration pneumonia. You are now medically ready for discharge. Please see the following notes:   - Please complete the course of antibiotics. It is called amoxicillin-clavulanate (Augmentin) 875-125mg  twice daily for three more days. Last dose should be 07/12/2021.   It was a pleasure meeting you, Ms. Froio. I wish you the best and hope you have a happy and healthy 2023!   Thank you,  Sanjuan Dame, MD  Signed: Sanjuan Dame, MD 07/10/2021, 10:42 AM   Pager: [phone]

## 2021-07-10 NOTE — TOC Progression Note (Signed)
Transition of Care [facility]) - Progression Note    Patient Details  Name: Lauren Blackwell MRN: [id number] Date of Birth: 1966-04-26  Transition of Care [facility]) CM/SW Contact  Levada Schilling Phone Number: 07/10/2021, 2:06 PM  Clinical Narrative:    CSW spoke with pt at bedside.  Pt does not know how she ended up in [place].  Pt is from [place].  Pt has a friend in [place] and CSW advised pt to contact her friend.  Currently, there are no opening at shelters today.  Pt was given resource information for shelters and substance abuse.  CSW gave pt's nurse 2 bus passes for transportation.  Pt stated " that she is afraid because she is not from here and does not know her way around".        Expected Discharge Plan and Services           Expected Discharge Date: 07/10/21                                     Social Determinants of Health (SDOH) Interventions    Readmission Risk Interventions No flowsheet data found.

## 2021-07-10 NOTE — ED Provider Triage Note (Signed)
Emergency Medicine Provider Triage Evaluation Note  Lauren Blackwell , a 56 y.o. female  was evaluated in triage.  Pt complains of shortness of breath onset today.  She does admitted to the hospital and discharged today and diagnosed with aspiration pneumonia.  She was given a prescription for Augmentin which she picked up today.  She notes that she has had associated generalized malaise and chills since being discharged.  Denies fever, nausea, vomiting.  Per patient chart review: Patient was seen in the ED on 07/07/2020 and admitted to the hospital for accidental heroin overdose.  She was discharged from the hospital today and given a prescription for Augmentin for aspiration pneumonia.  Review of Systems  Positive: As per HPI above. Negative: Nausea, vomiting  Physical Exam  BP 124/86    Pulse 98    Temp 98.2 F (36.8 C) (Oral)    Resp 18    Ht 5' (1.524 m)    Wt 52 kg    SpO2 94%    BMI 22.39 kg/m  Gen:   Awake, no distress   Resp:  Normal effort  MSK:   Moves extremities without difficulty   Medical Decision Making  Medically screening exam initiated at 7:26 PM.  Appropriate orders placed.  Seri Kimmer was informed that the remainder of the evaluation will be completed by another provider, this initial triage assessment does not replace that evaluation, and the importance of remaining in the ED until their evaluation is complete.    [name] A, PA-C 07/10/21 1932

## 2021-07-10 NOTE — Care Management (Signed)
Spoke w MD and pharmacist to have 3 day supply of Augmentin filled here so patient can take it with her. CSW to see for homeless resources and transportation needs.

## 2021-07-10 NOTE — ED Triage Notes (Signed)
Pt BIB [facility] for eval of SOB. EMS reports pt was dcd from here at 1500 after being dx'd w/ PNA. Reports that she has felt worse since that time and here for re-evaluation. Pt reports she feels like the cold/rain is making it worse. NARD in triage.

## 2021-07-11 MED ORDER — ACETAMINOPHEN 325 MG PO TABS
650.0000 mg | ORAL_TABLET | Freq: Once | ORAL | Status: AC
  Administered 2021-07-11: 650 mg via ORAL
  Filled 2021-07-11: qty 2

## 2021-07-11 NOTE — ED Provider Notes (Signed)
[facility] EMERGENCY DEPARTMENT Provider Note   CSN: [id number] Arrival date & time: 07/10/21  1835     History  Chief Complaint  Patient presents with   Shortness of Breath    Lauren Blackwell is a 56 y.o. female.   Shortness of Breath Associated symptoms: cough    Patient is a 57 year old female with a past medical history significant for methamphetamine use, average use, COPD  Patient is presented emergency room today with complaints of cough congestion and fatigue.  Lauren Blackwell states that Lauren Blackwell was discharged yesterday and given that Lauren Blackwell is currently homeless went outside.  Lauren Blackwell states that the cold weather seem to make her breathing worse.  Lauren Blackwell endorses some coughing which is not significantly productive.  No hemoptysis denies any chest pain  No other associate symptoms.    Home Medications Prior to Admission medications   Medication Sig Start Date End Date Taking? Authorizing Provider  acetaminophen (TYLENOL) 500 MG tablet Take 1,000 mg by mouth every 6 (six) hours as needed for moderate pain or headache.    [provider]  amoxicillin-clavulanate (AUGMENTIN) 875-125 MG tablet Take 1 tablet by mouth every 12 (twelve) hours for 3 days. 07/10/21 07/13/21  Evlyn Kanner, MD      Allergies    Patient has no known allergies.    Review of Systems   Review of Systems  Respiratory:  Positive for cough and shortness of breath.    Physical Exam Updated Vital Signs BP 112/78 (BP Location: Left Arm)    Pulse 88    Temp 97.9 F (36.6 C) (Oral)    Resp 20    Ht 5' (1.524 m)    Wt 52 kg    SpO2 94%    BMI 22.39 kg/m  Physical Exam Vitals and nursing note reviewed.  Constitutional:      General: Lauren Blackwell is not in acute distress. HENT:     Head: Normocephalic and atraumatic.     Nose: Nose normal.  Eyes:     General: No scleral icterus. Cardiovascular:     Rate and Rhythm: Normal rate and regular rhythm.     Pulses: Normal pulses.     Heart sounds: Normal  heart sounds.  Pulmonary:     Effort: Pulmonary effort is normal. No respiratory distress.     Breath sounds: No wheezing.     Comments: Faintly coarse lung sounds with good tidal volumes.  No tachypnea.  Speaking full sentences. Abdominal:     Palpations: Abdomen is soft.     Tenderness: There is no abdominal tenderness.  Musculoskeletal:     Cervical back: Normal range of motion.     Right lower leg: No edema.     Left lower leg: No edema.  Skin:    General: Skin is warm and dry.     Capillary Refill: Capillary refill takes less than 2 seconds.  Neurological:     Mental Status: Lauren Blackwell is alert. Mental status is at baseline.  Psychiatric:        Mood and Affect: Mood normal.        Behavior: Behavior normal.    ED Results / Procedures / Treatments   Labs (all labs ordered are listed, but only abnormal results are displayed) Labs Reviewed  BASIC METABOLIC PANEL - Abnormal; Notable for the following components:      Result Value   Potassium 3.4 (*)    Glucose, Bld 117 (*)    Calcium 8.4 (*)  All other components within normal limits  CBC WITH DIFFERENTIAL/PLATELET    EKG None  Radiology No results found.  Procedures Procedures    Medications Ordered in ED Medications  acetaminophen (TYLENOL) tablet 650 mg (650 mg Oral Given 07/11/21 0419)    ED Course/ Medical Decision Making/ A&P Clinical Course as of 07/11/21 2233  Mon Jul 11, 2021  0410 Patient recently admitted for aspiration pneumonia in the setting of opioid overdose due to accidental overuse.  I reviewed her hospital stay records.  Blood cultures were obtained and are negative for growth on day 3. [WF]    Clinical Course User Index [WF] Gailen Shelter, [place]                           Medical Decision Making Risk OTC drugs.   Patient is a 56 year old female homeless who suffers from recreational drug use  Recently admitted to the hospital and discharged after improvement from accidental overdose  of opioids.  Lauren Blackwell is being treated for aspiration pneumonia.  Lauren Blackwell states that Lauren Blackwell left when outside in the cold air and seemed to make her cough worse.  Lauren Blackwell is satting 94% on room air ambulated extensively in the hallway without desaturation below 90%.  Given incentive spirometer and adjusted Tylenol.  Return precautions given.  If Lauren Blackwell has any worsening difficulty breathing Lauren Blackwell should back to the ER for reevaluation.  Labs were obtained in triage which are unremarkable BMP and CBC is without leukocytosis or anemia.  I specifically doubt PE, ACS, ARDS or other acute emergent cause of patient's dyspnea other than symptoms from viral infection versus pneumonia Lauren Blackwell is taking her antibiotics.  My reevaluation the patient Lauren Blackwell is asleep in bed oxygen levels are 97% and Lauren Blackwell appears to be without any discomfort.  Will discharge home at this time.  Return precautions given and understood.  Tilly Pernice was evaluated in Emergency Department on 07/11/2021 for the symptoms described in the history of present illness. Lauren Blackwell was evaluated in the context of the global COVID-19 pandemic, which necessitated consideration that the patient might be at risk for infection with the SARS-CoV-2 virus that causes COVID-19. Institutional protocols and algorithms that pertain to the evaluation of patients at risk for COVID-19 are in a state of rapid change based on information released by regulatory bodies including the CDC and federal and state organizations. These policies and algorithms were followed during the patient's care in the ED.   Final Clinical Impression(s) / ED Diagnoses Final diagnoses:  Shortness of breath    Rx / DC Orders ED Discharge Orders     None         Gailen Shelter, [place] 07/11/21 2235    Derwood Kaplan, MD 07/12/21 [phone]

## 2021-07-11 NOTE — ED Notes (Signed)
Ambulated pt with spo2, pt stated she felt fine walking but "a little weak". Spo2 at 90 while walking

## 2021-07-11 NOTE — Discharge Instructions (Addendum)
Please continue to take antibiotics as prescribed.  Drink plenty of water, use the incentive spirometer as directed 4 times daily.  Return to the ER for any new or concerning symptoms including more difficulty breathing or any other new or concerning symptoms.  Take Tylenol 1000 mg every 6 hours.  Please refrain from any drug use.

## 2021-07-13 LAB — CULTURE, BLOOD (ROUTINE X 2)
Culture: NO GROWTH
Special Requests: ADEQUATE

## 2021-10-24 ENCOUNTER — Emergency Department
Admission: EM | Admit: 2021-10-24 | Discharge: 2021-10-24 | Disposition: A | Discharge status: Home / Self Care | Payer: 59 | Attending: Emergency Medicine | Admitting: Emergency Medicine

## 2021-10-24 ENCOUNTER — Emergency Department (HOSPITAL_COMMUNITY): Payer: 59

## 2021-10-24 ENCOUNTER — Encounter (HOSPITAL_COMMUNITY): Payer: Self-pay

## 2021-10-24 ENCOUNTER — Other Ambulatory Visit: Payer: Self-pay

## 2021-10-24 DIAGNOSIS — R079 Chest pain, unspecified: Secondary | ICD-10-CM

## 2021-10-24 DIAGNOSIS — J189 Pneumonia, unspecified organism: Secondary | ICD-10-CM | POA: Insufficient documentation

## 2021-10-24 DIAGNOSIS — Z8719 Personal history of other diseases of the digestive system: Secondary | ICD-10-CM | POA: Insufficient documentation

## 2021-10-24 DIAGNOSIS — N132 Hydronephrosis with renal and ureteral calculous obstruction: Secondary | ICD-10-CM | POA: Insufficient documentation

## 2021-10-24 DIAGNOSIS — Z20822 Contact with and (suspected) exposure to covid-19: Secondary | ICD-10-CM | POA: Insufficient documentation

## 2021-10-24 DIAGNOSIS — R531 Weakness: Secondary | ICD-10-CM | POA: Insufficient documentation

## 2021-10-24 HISTORY — DX: Chronic obstructive pulmonary disease, unspecified (CMS HCC): J44.9

## 2021-10-24 LAB — URINALYSIS, MACRO/MICRO
BILIRUBIN: NEGATIVE mg/dL
BLOOD: NEGATIVE mg/dL
GLUCOSE: NEGATIVE mg/dL
KETONES: NEGATIVE mg/dL
LEUKOCYTES: NEGATIVE WBCs/uL
NITRITE: NEGATIVE
PH: 6.5 (ref 5.0–9.0)
PROTEIN: NEGATIVE mg/dL
SPECIFIC GRAVITY: 1.006 (ref 1.001–1.030)
UROBILINOGEN: 0.2 mg/dL (ref 0.2–1.0)

## 2021-10-24 LAB — COMPREHENSIVE METABOLIC PANEL, NON-FASTING
ALBUMIN: 4.1 g/dL (ref 3.5–5.0)
ALKALINE PHOSPHATASE: 107 U/L (ref 50–130)
ALT (SGPT): 9 U/L (ref 8–22)
ANION GAP: 11 mmol/L (ref 4–13)
AST (SGOT): 17 U/L (ref 8–45)
BILIRUBIN TOTAL: 0.3 mg/dL (ref 0.3–1.3)
BUN/CREA RATIO: 9 (ref 6–22)
BUN: 8 mg/dL (ref 8–25)
CALCIUM: 9.3 mg/dL (ref 8.5–10.0)
CHLORIDE: 107 mmol/L (ref 96–111)
CO2 TOTAL: 22 mmol/L (ref 22–30)
CREATININE: 0.91 mg/dL (ref 0.60–1.05)
ESTIMATED GFR: 75 mL/min/BSA (ref >=60)
GLUCOSE: 65 mg/dL (ref 65–125)
POTASSIUM: 4.2 mmol/L (ref 3.5–5.1)
PROTEIN TOTAL: 7.6 g/dL (ref 6.4–8.3)
SODIUM: 140 mmol/L (ref 136–145)

## 2021-10-24 LAB — CBC WITH DIFF
BASOPHIL #: <0.1 10*3/uL (ref <=0.20)
BASOPHIL %: 1 %
EOSINOPHIL #: 0.37 10*3/uL (ref <=0.50)
EOSINOPHIL %: 4 %
HCT: 47.4 % — ABNORMAL HIGH (ref 34.8–46.0)
HGB: 16 g/dL (ref 11.5–16.0)
IMMATURE GRANULOCYTE #: <0.1 10*3/uL (ref <0.10)
IMMATURE GRANULOCYTE %: 0 % (ref 0–1)
LYMPHOCYTE #: 2.94 10*3/uL (ref 1.00–4.80)
LYMPHOCYTE %: 33 %
MCH: 31.1 pg (ref 26.0–32.0)
MCHC: 33.8 g/dL (ref 31.0–35.5)
MCV: 92 fL (ref 78.0–100.0)
MONOCYTE #: 0.57 10*3/uL (ref 0.20–1.10)
MONOCYTE %: 6 %
MPV: 8.7 fL (ref 8.7–12.5)
NEUTROPHIL #: 5.1 10*3/uL (ref 1.50–7.70)
NEUTROPHIL %: 56 %
PLATELETS: 277 10*3/uL (ref 150–400)
RBC: 5.15 10*6/uL (ref 3.85–5.22)
RDW-CV: 12.5 % (ref 11.5–15.5)
WBC: 9.1 10*3/uL (ref 3.7–11.0)

## 2021-10-24 LAB — ECG 12 LEAD
Atrial Rate: 91 {beats}/min
Calculated P Axis: 59 degrees
Calculated R Axis: 26 degrees
Calculated T Axis: 60 degrees
PR Interval: 150 ms
QRS Duration: 78 ms
QT Interval: 364 ms
QTC Calculation: 447 ms
Ventricular rate: 91 {beats}/min

## 2021-10-24 LAB — RED TOP TUBE

## 2021-10-24 LAB — LACTIC ACID LEVEL W/ REFLEX FOR LEVEL >2.0: LACTIC ACID: 2 mmol/L (ref 0.5–2.2)

## 2021-10-24 LAB — COVID-19 ~~LOC~~ MOLECULAR LAB TESTING
INFLUENZA VIRUS TYPE A: NOT DETECTED
INFLUENZA VIRUS TYPE B: NOT DETECTED
SARS-COV-2: NOT DETECTED

## 2021-10-24 LAB — MAGNESIUM: MAGNESIUM: 2 mg/dL (ref 1.8–2.6)

## 2021-10-24 LAB — PTT (PARTIAL THROMBOPLASTIN TIME): APTT: 27.5 seconds (ref 23.0–32.0)

## 2021-10-24 LAB — LIPASE: LIPASE: 14 U/L (ref 10–60)

## 2021-10-24 LAB — TROPONIN-I: TROPONIN I: <2.7 ng/L (ref <=35.0)

## 2021-10-24 LAB — PT/INR
INR: 0.99 (ref 0.90–1.10)
PROTHROMBIN TIME: 10.4 seconds (ref 9.0–13.0)

## 2021-10-24 MED ORDER — SODIUM CHLORIDE 0.9 % IV BOLUS
1000.0000 mL | INJECTION | Status: AC
Start: 2021-10-24 — End: 2021-10-24
  Administered 2021-10-24: 0 mL via INTRAVENOUS
  Administered 2021-10-24: 1000 mL via INTRAVENOUS

## 2021-10-24 MED ORDER — AZITHROMYCIN 250 MG TABLET
ORAL_TABLET | ORAL | 0 refills | Status: DC
Start: 2021-10-24 — End: 2021-11-04

## 2021-10-24 NOTE — ED Provider Notes (Signed)
[facility]  ED Primary Provider Note  History of Present Illness      Chief Complaint   Patient presents with   . Weakness       Janet Bender, date of birth November 29, 1965, is a 56 y.o. female who arrived by Car.    HPI     HPI:  Patient presents for evaluation of generalized weakness fatigue diarrhea and abdominal cramping.  Patient states symptoms have been going on for the past 3 or 4 days.  She is had episodes of diarrhea that is nonbloody nonbilious.  She does have a history of IBS.  She denies any intra-abdominal surgeries aside from bilateral tubal ligation many years ago.  She denies any dysuria.  She denies chest pain shortness of breath.  She states she just feels weak and run down and achy all over.  She is not been running a fever that she is aware of.  She presents emergency room because of the symptoms above worsening.      Review of Systems   Constitutional: Positive for fatigue. Negative for chills and fever.   HENT: Negative for ear pain and sore throat.    Eyes: Negative for pain and visual disturbance.   Respiratory: Negative for cough and shortness of breath.    Cardiovascular: Negative for chest pain and palpitations.   Gastrointestinal: Positive for abdominal pain and diarrhea. Negative for vomiting.   Genitourinary: Negative for dysuria and hematuria.   Musculoskeletal: Positive for arthralgias and myalgias. Negative for back pain.   Skin: Negative for color change and rash.   Neurological: Positive for weakness. Negative for seizures and syncope.   All other systems reviewed and are negative.         Historical Data    History Reviewed This Encounter:        Physical Exam    ED Triage Vitals   BP (Non-Invasive) 10/24/21 0957 119/84   Heart Rate 10/24/21 0954 99   Respiratory Rate 10/24/21 0954 18   Temperature 10/24/21 0954 36.9 C (98.4 F)   SpO2 10/24/21 0954 97 %   Weight 10/24/21 0954 49.9 kg (110 lb)   Height 10/24/21 0954 1.549 m ( )       Physical Exam  Vitals  and nursing note reviewed.   Constitutional:       General: She is not in acute distress.     Appearance: She is well-developed.   HENT:      Head: Normocephalic and atraumatic.   Eyes:      Conjunctiva/sclera: Conjunctivae normal.   Cardiovascular:      Rate and Rhythm: Normal rate and regular rhythm.      Heart sounds: No murmur heard.  Pulmonary:      Effort: Pulmonary effort is normal. No respiratory distress.      Breath sounds: Normal breath sounds.   Abdominal:      Palpations: Abdomen is soft.      Tenderness: There is no abdominal tenderness.   Musculoskeletal:         General: No swelling.      Cervical back: Neck supple.   Skin:     General: Skin is warm and dry.      Capillary Refill: Capillary refill takes less than 2 seconds.   Neurological:      Mental Status: She is alert.      Comments: At the time of my exam, cranial nerves 2-12 are grossly intact.  Extraocular muscles are  intact.  Finger-nose is intact.  Equal grip strength bilaterally.  No pronator drift.  5/5 muscle strength throughout all 4 extremities.  No slurring of the speech.  No facial droop appreciated.   Psychiatric:         Mood and Affect: Mood normal.            DDx:  Differential diagnosis includes, but is not limited to weakness, fatigue, dehydration, electrolyte abnormality, IBS, diarrhea      Orders Placed This Encounter   . ROUTINE STOOL CULTURE (INCLUDING E. COLI SHIGA TOXIN)   . CLOSTRIDIUM DIFFICILE TOXIN A/B   . XR AP MOBILE CHEST   . CT ABDOMEN PELVIS WO IV CONTRAST   . CBC/DIFF   . COMPREHENSIVE METABOLIC PANEL, NON-FASTING   . MAGNESIUM   . PT/INR   . PTT (PARTIAL THROMBOPLASTIN TIME)   . TROPONIN-I   . LIPASE   . URINALYSIS WITH REFLEX MICROSCOPIC AND CULTURE IF POSITIVE   . OCCULT BLOOD, STOOL   . LACTIC ACID LEVEL W/ REFLEX FOR LEVEL >2.0   . COVID-19 SCREENING (INCLUDES FLU A/B)   . CBC WITH DIFF   . URINALYSIS, MACRO/MICRO   . EXTRA TUBES   . RED TOP TUBE   . PULSE OXIMETRY - CONTINUOUS   . ECG 12 LEAD   . INSERT &  MAINTAIN PERIPHERAL IV ACCESS   . SCHEDULE LUNG MASS FOLLOW-UP WITH PULMONARY - [place]   . NS bolus infusion 1,000 mL   . azithromycin (ZITHROMAX) 250 mg Oral Tablet         Patient Data    Labs reviewed by myself  Results for orders placed or performed during the hospital encounter of 10/24/21 (from the past 12 hour(s))   COMPREHENSIVE METABOLIC PANEL, NON-FASTING   Result Value Ref Range    SODIUM 140 136 - 145 mmol/L    POTASSIUM 4.2 3.5 - 5.1 mmol/L    CHLORIDE 107 96 - 111 mmol/L    CO2 TOTAL 22 22 - 30 mmol/L    ANION GAP 11 4 - 13 mmol/L    BUN 8 8 - 25 mg/dL    CREATININE 7.90 2.40 - 1.05 mg/dL    BUN/CREA RATIO 9 6 - 22    ESTIMATED GFR 75 >=60 mL/min/BSA    ALBUMIN 4.1 3.5 - 5.0 g/dL     CALCIUM 9.3 8.5 - 97.3 mg/dL    GLUCOSE 65 65 - 532 mg/dL    ALKALINE PHOSPHATASE 107 50 - 130 U/L    ALT (SGPT) 9 8 - 22 U/L    AST (SGOT)  17 8 - 45 U/L    BILIRUBIN TOTAL 0.3 0.3 - 1.3 mg/dL    PROTEIN TOTAL 7.6 6.4 - 8.3 g/dL   MAGNESIUM   Result Value Ref Range    MAGNESIUM 2.0 1.8 - 2.6 mg/dL   PT/INR   Result Value Ref Range    PROTHROMBIN TIME 10.4 9.0 - 13.0 seconds    INR 0.99 0.90 - 1.10   PTT (PARTIAL THROMBOPLASTIN TIME)   Result Value Ref Range    APTT 27.5 23.0 - 32.0 seconds   TROPONIN-I   Result Value Ref Range    TROPONIN I <2.7 Female: <=35.0 ng/L; Female: <=14.0 ng/L ng/L   LIPASE   Result Value Ref Range    LIPASE 14 10 - 60 U/L   LACTIC ACID LEVEL W/ REFLEX FOR LEVEL >2.0   Result Value Ref Range    LACTIC ACID 2.0  0.5 - 2.2 mmol/L   COVID-19 SCREENING (INCLUDES FLU A/B)   Result Value Ref Range    SARS-COV-2 Not Detected Not Detected    INFLUENZA VIRUS TYPE A Not Detected Not Detected    INFLUENZA VIRUS TYPE B Not Detected Not Detected   CBC WITH DIFF   Result Value Ref Range    WBC 9.1 3.7 - 11.0 x10^3/uL    RBC 5.15 3.85 - 5.22 x10^6/uL    HGB 16.0 11.5 - 16.0 g/dL    HCT 96.0 (H) 45.4 - 46.0 %    MCV 92.0 78.0 - 100.0 fL    MCH 31.1 26.0 - 32.0 pg    MCHC 33.8 31.0 - 35.5 g/dL    RDW-CV 09.8  11.9 - 15.5 %    PLATELETS 277 150 - 400 x10^3/uL    MPV 8.7 8.7 - 12.5 fL    NEUTROPHIL % 56 %    LYMPHOCYTE % 33 %    MONOCYTE % 6 %    EOSINOPHIL % 4 %    BASOPHIL % 1 %    NEUTROPHIL # 5.10 1.50 - 7.70 x10^3/uL    LYMPHOCYTE # 2.94 1.00 - 4.80 x10^3/uL    MONOCYTE # 0.57 0.20 - 1.10 x10^3/uL    EOSINOPHIL # 0.37 <=0.50 x10^3/uL    BASOPHIL # <0.10 <=0.20 x10^3/uL    IMMATURE GRANULOCYTE % 0 0 - 1 %    IMMATURE GRANULOCYTE # <0.10 <0.10 x10^3/uL   URINALYSIS, MACRO/MICRO   Result Value Ref Range    COLOR Yellow Yellow    APPEARANCE Clear Clear    SPECIFIC GRAVITY 1.006 1.001 - 1.030    PH 6.5 5.0 - 9.0    PROTEIN Negative Negative mg/dL    GLUCOSE Negative Negative mg/dL    KETONES Negative Negative mg/dL    UROBILINOGEN 0.2 0.2 - 1.0 mg/dL    BILIRUBIN Negative Negative mg/dL    BLOOD Negative Negative mg/dL    NITRITE Negative Negative    LEUKOCYTES Negative Negative WBCs/uL        XR AP MOBILE CHEST   Final Result by Edi, Radresults In (05/08 1123)   Patchy right lower lobe opacity suspicious for developing infiltrate, correlate clinically. Follow-up imaging should document resolution to exclude any underlying pathology.         Signed by Lady Deutscher, MD      CT ABDOMEN PELVIS WO IV CONTRAST   Final Result by Edi, Radresults In (05/08 1117)   Mild right renal hydronephrosis. No definite stone identified through the course the ureter. Right nephrolithiasis.   Right lower lobe lung mass 1.2 cm. Further clinical evaluation to rule out lung neoplasm recommended.            Signed by Francina Ames, MD              EKG:  Most Recent EKG This Encounter   ECG 12 LEAD    Collection Time: 10/24/21 10:13 AM   Result Value    Ventricular rate 91    Atrial Rate 91    PR Interval 150    QRS Duration 78    QT Interval 364    QTC Calculation 447    Calculated P Axis 59    Calculated R Axis 26    Calculated T Axis 60    Narrative    Normal sinus rhythm  Normal ECG  No previous ECGs available  Confirmed by Darliss Ridgel [phone]) on 10/24/2021 10:27:36 AM  ED Course:   Relevant prior external notes and tests were reviewed by myself if available.   During the patient's stay in the emergency department, the above listed imaging and/or labs were performed to assist with medical decision making and were reviewed by myself when available for review.    Patient rechecked throughout remainder of emergency department course.    All questions/concerns addressed to the best of our ability.    ED Course as of 10/24/21 1153   Mon Oct 24, 2021   1148 Patient re-evaluated this time.  She is resting comfortably feeling better.  She is been up walking to the restroom without any difficulty.  COVID was negative urinalysis is clear.  Her lab work shows normal electrolytes and LFTs.  Her white blood cell count 9.1.  Troponin is undetectable and lactic acid is 2.  She has not had any episodes of diarrhea here so we have not been able to send any occult blood or stool toxins.  We did do a CT scan of the abdomen pelvis which showed hydronephrosis but no evidence of obstructing ureteral stone.  Is also a lung finding that should be followed up with the primary care physician.  Chest x-ray shows possible developing infiltrate.  At this point she is on room air she is 97%.  Her vitals are stable.  She does not feel that she would have to stay in the hospital.  I do not find any laboratory or imaging findings that would require her to stay in the hospital.  I am going to cover her with antibiotics.  She is going to follow-up with her primary care physician for follow-up of the lung mass found on CT scan.  In the meantime she will be treated with antibiotics and return to the emergency room for any worsening symptoms.  At this time she is going to be allowed to be discharged.  She has no further questions.   1152 I did place an order to schedule lung mass follow-up with pulmonary         Medical Decision Making         Medical Decision  Making  I did order lab work includes CBC, CMP, cardiac markers.  We will do EKG and chest x-ray.  We will do CT scan of the abdomen pelvis.  I did order stool cultures and C diff as well as urinalysis culture sensitivity and lactic acid level.  We will do COVID testing.    Pneumonia: acute illness or injury  Weakness: acute illness or injury  Amount and/or Complexity of Data Reviewed  Labs: ordered. Decision-making details documented in ED Course.  Radiology: ordered. Decision-making details documented in ED Course.  ECG/medicine tests: ordered and independent interpretation performed. Decision-making details documented in ED Course.      Risk  Prescription drug management.               Clinical Impression:  Clinical Impression   Weakness (Primary)   Pneumonia          Disposition:  Discharged       Primary care physician    In 1 day      Pulmonary, [facility]  [address]  [place] [address]  [phone]          Current Discharge Medication List      START taking these medications    Details   azithromycin (ZITHROMAX) 250 mg Oral Tablet Take 500 mg (2 tab) on day  1; take 250 mg (1 tab) on days 2-5.  Qty: 6 Tablet, Refills: 0                 // Maryclare BeanMichael W. Juan Olthoff, DO  10/24/2021 , 10:08 .   [facility], [facility]     54}

## 2021-10-24 NOTE — ED Triage Notes (Signed)
FEELS WEAK ALL OVER, DIARRHEA FEW DAYS, BODY ACHES

## 2021-10-24 NOTE — ED Nurses Note (Signed)
Patient discharged home with family.  AVS reviewed with patient/care giver.  A written copy of the AVS and discharge instructions was given to the patient/care giver.  Questions sufficiently answered as needed.  Patient/care giver encouraged to follow up with PCP as indicated.  In the event of an emergency, patient/care giver instructed to call 911 or go to the nearest emergency room.

## 2021-10-24 NOTE — Discharge Instructions (Signed)
Please follow-up with your primary care physician as above.  Please return to the emergency room for any worsening symptoms.

## 2021-10-26 ENCOUNTER — Encounter (HOSPITAL_COMMUNITY): Payer: Self-pay

## 2021-10-26 ENCOUNTER — Other Ambulatory Visit: Payer: Self-pay

## 2021-10-26 ENCOUNTER — Emergency Department
Admission: EM | Admit: 2021-10-26 | Discharge: 2021-10-26 | Disposition: A | Discharge status: Home / Self Care | Payer: 59 | Attending: Emergency Medicine | Admitting: Emergency Medicine

## 2021-10-26 DIAGNOSIS — R197 Diarrhea, unspecified: Secondary | ICD-10-CM | POA: Insufficient documentation

## 2021-10-26 HISTORY — DX: Other psychoactive substance abuse, uncomplicated (CMS HCC): F19.10

## 2021-10-26 HISTORY — DX: Alcohol abuse, uncomplicated: F10.10

## 2021-10-26 LAB — CBC WITH DIFF
BASOPHIL #: <0.1 10*3/uL (ref <=0.20)
BASOPHIL %: 1 %
EOSINOPHIL #: 0.21 10*3/uL (ref <=0.50)
EOSINOPHIL %: 2 %
HCT: 45.7 % (ref 34.8–46.0)
HGB: 15.1 g/dL (ref 11.5–16.0)
IMMATURE GRANULOCYTE #: <0.1 10*3/uL (ref <0.10)
IMMATURE GRANULOCYTE %: 0 % (ref 0–1)
LYMPHOCYTE #: 2.73 10*3/uL (ref 1.00–4.80)
LYMPHOCYTE %: 27 %
MCH: 30.7 pg (ref 26.0–32.0)
MCHC: 33 g/dL (ref 31.0–35.5)
MCV: 92.9 fL (ref 78.0–100.0)
MONOCYTE #: 0.53 10*3/uL (ref 0.20–1.10)
MONOCYTE %: 5 %
MPV: 9 fL (ref 8.7–12.5)
NEUTROPHIL #: 6.63 10*3/uL (ref 1.50–7.70)
NEUTROPHIL %: 65 %
PLATELETS: 298 10*3/uL (ref 150–400)
RBC: 4.92 10*6/uL (ref 3.85–5.22)
RDW-CV: 12.5 % (ref 11.5–15.5)
WBC: 10.2 10*3/uL (ref 3.7–11.0)

## 2021-10-26 LAB — COMPREHENSIVE METABOLIC PANEL, NON-FASTING
ALBUMIN: 4 g/dL (ref 3.5–5.0)
ALKALINE PHOSPHATASE: 100 U/L (ref 50–130)
ALT (SGPT): 8 U/L (ref 8–22)
ANION GAP: 10 mmol/L (ref 4–13)
AST (SGOT): 17 U/L (ref 8–45)
BILIRUBIN TOTAL: 0.3 mg/dL (ref 0.3–1.3)
BUN/CREA RATIO: 10 (ref 6–22)
BUN: 9 mg/dL (ref 8–25)
CALCIUM: 9.1 mg/dL (ref 8.5–10.0)
CHLORIDE: 105 mmol/L (ref 96–111)
CO2 TOTAL: 24 mmol/L (ref 22–30)
CREATININE: 0.86 mg/dL (ref 0.60–1.05)
ESTIMATED GFR: 80 mL/min/BSA (ref >=60)
GLUCOSE: 79 mg/dL (ref 65–125)
POTASSIUM: 4.2 mmol/L (ref 3.5–5.1)
PROTEIN TOTAL: 7.3 g/dL (ref 6.4–8.3)
SODIUM: 139 mmol/L (ref 136–145)

## 2021-10-26 LAB — URINALYSIS, MICROSCOPIC

## 2021-10-26 LAB — URINALYSIS, MACRO/MICRO
BILIRUBIN: NEGATIVE mg/dL
BLOOD: NEGATIVE mg/dL
GLUCOSE: NEGATIVE mg/dL
KETONES: NEGATIVE mg/dL
LEUKOCYTES: NEGATIVE WBCs/uL
NITRITE: NEGATIVE
PH: 6 (ref 5.0–9.0)
PROTEIN: NEGATIVE mg/dL
SPECIFIC GRAVITY: 1.009 (ref 1.001–1.030)
UROBILINOGEN: 0.2 mg/dL (ref 0.2–1.0)

## 2021-10-26 LAB — BLUE TOP TUBE

## 2021-10-26 NOTE — ED Triage Notes (Addendum)
SENT FROM [facility]-WAS TOLD CAN NOT RETURN THERE DUE TO- FOR 4 DAYS: ABD PAIN, INCONTINENT OF DIARRHEA. SS CONSULTED

## 2021-10-26 NOTE — ED Provider Notes (Signed)
Department of Emergency Medicine  [facility]  10/26/2021        Chief Complaint   Patient presents with   . Diarrhea   . Medical Screening       Patient is a 56 y.o.  female presenting to the ED with chief complaint of INCONTINENCE OF STOOL AND URINE.  PATIENT IS A 36-YEAR-OLD FEMALE WHO IS IN A RECOVERY HALFWAY HOUSE FOR DRUG AND ALCOHOL ABUSE.  STATES SHE IS BEEN CLEAN FOR 2 MONTHS.  WAS SEEN HERE FOR DIARRHEA AND CRAMPY ABDOMINAL PAIN A FEW DAYS AGO WITH A NEGATIVE WORKUP.  NOW SENT HERE FROM THE HALFWAY HOUSE BECAUSE OF CONTINUED DIARRHEA.  PATIENT HAS HAD NO FEVER OR CHILLS.  SHE HAS NO BACK PAIN.  SHE HAS NO WEAKNESS OR PARESTHESIAS DOWN THE LEGS.  SHE HAS NO INJURY TO THE BACK.    Review of Systems:    Constitutional: No fever, chills  Skin: No rashes or lesions  HENT: No sore throat, ear pain, or difficulty swallowing  Eyes: No vision changes, redness, discharge  Cardio: No chest pain, palpitations   Respiratory: No cough, wheezing or SOB  GI:  No nausea or vomiting.REPORTED DIARRHEA AND STOOL INCONTINENCE  GU:  No dysuria, hematuria, polyuria  MSK: No joint pain.  No neck or back pain  Neuro: No numbness, tingling, or weakness.  No headache  Psych: No SI or HI. Normal mood    All other symptoms reviewed and are negative, unless commented on in the HPI.     Past Medical History:  Past Medical History:   Diagnosis Date   . Alcohol abuse    . COPD (chronic obstructive pulmonary disease) (CMS HCC)    . Drug abuse (CMS [facility])      Past Surgical History:   Procedure Laterality Date   . Gastroscopy with feeding tube placement       Above history reviewed with patient.  Allergies, medication list, and old records also reviewed.     Filed Vitals:    10/26/21 1004   BP: 115/86   Pulse: 100   Resp: 18   Temp: 37 C (98.6 F)   SpO2: 97%       Physical Exam:     Nursing note and vitals reviewed.  Vital signs reviewed as above. No acute distress.   Constitutional: Pt is well-developed and  well-nourished.   Head: Normocephalic and atraumatic.   Eyes: Conjunctivae are normal. Pupils are equal, round, and reactive to light. EOM are intact  Neck: Soft, supple, full range of motion.  Cardiovascular: RRR.  No Murmurs/rubs/gallops. Distal pulses present and equal bilaterally.  Pulmonary/Chest: Normal BS BL with no distress. No audible wheezes or crackles are noted.  Abdominal: Soft, nontender, nondistended.  No rebound, guarding, or masses.  Musculoskeletal: Normal range of motion. No deformities.  Exhibits no edema and no tenderness.   Neurological: CNs 2-12 grossly intact.  No focal deficits noted.  NORMAL REFLEXES, 5/5 MOTOR STRENGTH IN LOWER EXTREMITIES  Skin: Warm and dry. No rash or lesions  Psychiatric: Patient has a normal mood and affect.     Workup:     Labs:  Results for orders placed or performed during the hospital encounter of 10/26/21 (from the past 24 hour(s))   CBC/DIFF    Narrative    The following orders were created for panel order CBC/DIFF.  Procedure  Abnormality         Status                     ---------                               -----------         ------                     CBC WITH [id number]                                    Final result                 Please view results for these tests on the individual orders.   COMPREHENSIVE METABOLIC PANEL, NON-FASTING   Result Value Ref Range    SODIUM 139 136 - 145 mmol/L    POTASSIUM 4.2 3.5 - 5.1 mmol/L    CHLORIDE 105 96 - 111 mmol/L    CO2 TOTAL 24 22 - 30 mmol/L    ANION GAP 10 4 - 13 mmol/L    BUN 9 8 - 25 mg/dL    CREATININE 0.86 0.60 - 1.05 mg/dL    BUN/CREA RATIO 10 6 - 22    ESTIMATED GFR 80 >=60 mL/min/BSA    ALBUMIN 4.0 3.5 - 5.0 g/dL     CALCIUM 9.1 8.5 - 10.0 mg/dL    GLUCOSE 79 65 - 125 mg/dL    ALKALINE PHOSPHATASE 100 50 - 130 U/L    ALT (SGPT) 8 8 - 22 U/L    AST (SGOT)  17 8 - 45 U/L    BILIRUBIN TOTAL 0.3 0.3 - 1.3 mg/dL    PROTEIN TOTAL 7.3 6.4 - 8.3 g/dL   URINALYSIS WITH  REFLEX MICROSCOPIC AND CULTURE IF POSITIVE    Specimen: Urine, Clean Catch    Narrative    The following orders were created for panel order URINALYSIS WITH REFLEX MICROSCOPIC AND CULTURE IF POSITIVE.  Procedure                               Abnormality         Status                     ---------                               -----------         ------                     URINALYSIS, MACRO/MICRO[[id number]]      Abnormal            Final result               URINALYSIS, MICROSCOPIC[[id number]]      Abnormal            Final result                 Please view results for these tests on the individual orders.   CBC WITH DIFF   Result Value Ref Range    WBC 10.2 3.7 - 11.0 x10^3/uL    RBC  4.92 3.85 - 5.22 x10^6/uL    HGB 15.1 11.5 - 16.0 g/dL    HCT 45.7 34.8 - 46.0 %    MCV 92.9 78.0 - 100.0 fL    MCH 30.7 26.0 - 32.0 pg    MCHC 33.0 31.0 - 35.5 g/dL    RDW-CV 12.5 11.5 - 15.5 %    PLATELETS 298 150 - 400 x10^3/uL    MPV 9.0 8.7 - 12.5 fL    NEUTROPHIL % 65 %    LYMPHOCYTE % 27 %    MONOCYTE % 5 %    EOSINOPHIL % 2 %    BASOPHIL % 1 %    NEUTROPHIL # 6.63 1.50 - 7.70 x10^3/uL    LYMPHOCYTE # 2.73 1.00 - 4.80 x10^3/uL    MONOCYTE # 0.53 0.20 - 1.10 x10^3/uL    EOSINOPHIL # 0.21 <=0.50 x10^3/uL    BASOPHIL # <0.10 <=0.20 x10^3/uL    IMMATURE GRANULOCYTE % 0 0 - 1 %    IMMATURE GRANULOCYTE # <0.10 <0.10 x10^3/uL   URINALYSIS, MACRO/MICRO   Result Value Ref Range    COLOR Yellow Yellow    APPEARANCE Cloudy (A) Clear    SPECIFIC GRAVITY 1.009 1.001 - 1.030    PH 6.0 5.0 - 9.0    PROTEIN Negative Negative mg/dL    GLUCOSE Negative Negative mg/dL    KETONES Negative Negative mg/dL    UROBILINOGEN 0.2 0.2 - 1.0 mg/dL    BILIRUBIN Negative Negative mg/dL    BLOOD Negative Negative mg/dL    NITRITE Negative Negative    LEUKOCYTES Negative Negative WBCs/uL   EXTRA TUBES    Narrative    The following orders were created for panel order EXTRA TUBES.  Procedure                               Abnormality         Status                      ---------                               -----------         ------                     BLUE TOP [id number]                                    In process                   Please view results for these tests on the individual orders.   URINALYSIS, MICROSCOPIC   Result Value Ref Range    SQUAMOUS EPITHELIAL Many (A) None /hpf    WBCS 10-20 (A) None /hpf    RBCS 0-2 (A) None /hpf    BACTERIA Rare (A) Negative /hpf    HYALINE CASTS 3-5 (A) None /lpf       Imaging:         Orders Placed This Encounter   . ROUTINE STOOL CULTURE (INCLUDING E. COLI SHIGA TOXIN)   . CLOSTRIDIUM DIFFICILE TOXIN A/B   . URINE CULTURE,ROUTINE   . CBC/DIFF   . COMPREHENSIVE METABOLIC PANEL, NON-FASTING   . URINALYSIS WITH REFLEX MICROSCOPIC  AND CULTURE IF POSITIVE   . CBC WITH DIFF   . URINALYSIS, MACRO/MICRO   . EXTRA TUBES   . BLUE TOP TUBE   . URINALYSIS, MICROSCOPIC       Abnormal Lab results:  Labs Ordered/Reviewed   URINALYSIS, MACRO/MICRO - Abnormal; Notable for the following components:       Result Value    APPEARANCE Cloudy (*)     All other components within normal limits   URINALYSIS, MICROSCOPIC - Abnormal; Notable for the following components:    SQUAMOUS EPITHELIAL Many (*)     WBCS 10-20 (*)     RBCS 0-2 (*)     BACTERIA Rare (*)     HYALINE CASTS 3-5 (*)     All other components within normal limits   COMPREHENSIVE METABOLIC PANEL, NON-FASTING - Normal   ROUTINE STOOL CULTURE (INCLUDING E. COLI SHIGA TOXIN)   CLOSTRIDIUM DIFFICILE TOXIN A/B   URINE CULTURE,ROUTINE   CBC/DIFF    Narrative:     The following orders were created for panel order CBC/DIFF.  Procedure                               Abnormality         Status                     ---------                               -----------         ------                     CBC WITH [id number]                                    Final result                 Please view results for these tests on the individual orders.   URINALYSIS WITH REFLEX MICROSCOPIC AND CULTURE  IF POSITIVE    Narrative:     The following orders were created for panel order URINALYSIS WITH REFLEX MICROSCOPIC AND CULTURE IF POSITIVE.  Procedure                               Abnormality         Status                     ---------                               -----------         ------                     URINALYSIS, MACRO/MICRO[[id number]]      Abnormal            Final result               URINALYSIS, MICROSCOPIC[[id number]]      Abnormal            Final result  Please view results for these tests on the individual orders.   CBC WITH DIFF   EXTRA TUBES    Narrative:     The following orders were created for panel order EXTRA TUBES.  Procedure                               Abnormality         Status                     ---------                               -----------         ------                     BLUE TOP [id number]                                    In process                   Please view results for these tests on the individual orders.   BLUE TOP TUBE       No results found for this visit on 10/26/21 (from the past 720 hour(s)).     Plan: Appropriate labs and imaging ordered. Medical Records reviewed.    MDM:   During the patient's stay in the emergency department, the above listed imaging and/or labs were performed to assist with medical decision making and were reviewed by myself when available for review.         Medical Decision Making  PATIENT HAD A HALFWAY HOUSE FOR DRUG AND ALCOHOL REHAB.  WAS SEEN HERE COUPLE DAYS AGO WITH ABDOMINAL PAIN AND DIARRHEA.  WORKUP WAS NEGATIVE  REPORTS SHE WAS SENT BACK HERE FOR RECURRENT DIARRHEA AND INCONTINENCE OF STOOL  PATIENT WAS HERE FOR APPROXIMATELY 2-1/2 HOURS AND UNABLE TO PROVIDE A STOOL AND HER WORKUP IS OTHERWISE NEGATIVE  PATIENT HAS NO BACK INJURY, NO BACK TENDERNESS, NORMAL LOWER EXTREMITY NEUROLOGIC EXAM    Amount and/or Complexity of Data Reviewed  External Data Reviewed: labs, radiology and notes.  Labs: ordered.  Decision-making details documented in ED Course.  Radiology:  Decision-making details documented in ED Course.  ECG/medicine tests:  Decision-making details documented in ED Course.          Pt remained stable throughout the emergency department course.           *This note was partially generated using MModal Fluency Direct system, and there may be some incorrect words, spellings, and punctuation that were not noted in checking the note before saving.    Impression:   Clinical Impression   Diarrhea, unspecified type (Primary)       Plan:  DISCHARGED  Critical care time: 0    Derrek Monaco, MD  10/26/2021, 10:13

## 2021-10-26 NOTE — Care Management Notes (Addendum)
SAFE-T Protocol with C-SSRS ([place] Risk and Protective Factors) - Recent    Step 1: Identify Risk Factors                                                   C-SSRS Suicidal Ideation Severity Month   1) Wish to be dead  Have you wished you were dead or wished you could go to sleep and not wake up? no   2) Current suicidal thoughts  Have you actually had any thoughts of killing yourself? no     3) Suicidal thoughts w/ Method (w/no specific Plan or Intent or act)  Have you been thinking about how you might do this? no   4) Suicidal Intent without Specific Plan  Have you had these thoughts and had some intention of acting on them? no   5) Intent with Plan  Have you started to work out or worked out the details of how to kill yourself? Do you intend to carry out this plan? no   C-SSRS Suicidal Behavior: "Have you ever done anything, started to do anything, or prepared to do anything to end your life?"    Examples: Collected pills, obtained a gun, gave away valuables, wrote a will or suicide note, took out pills but didn't swallow any, held a gun but changed your mind or it was grabbed from your hand, went to the roof but didn't jump; or actually took pills, tried to shoot yourself, cut yourself, tried to hang yourself, etc.  If "YES" Was it within the past 3 months? Lifetime    yes    Past 3 Months    no   Activating Events:     N/a    Treatment History:     Previous psychiatric diagnosis and treatments    Other:  N/a   Clinical Status:     Hopelessness , Substance abuse or dependence       Access to lethal methods: Ask specifically about presence or absence of a firearm in the home or ease of accessing:    no     Step 2: Identify Protective Factors (Protective factors may not counteract significant acute suicide risk factors)   Internal:     Fear of death or dying due to pain and suffering and Identifies reasons for living External:     {Belief that suicide is immoral; high spirituality, Responsibility to family or  others; living with family and Engaged in work or school     Step 3: Specific questioning about Thoughts, Plans, and Suicidal Intent - (see Step 1 for Ideation Severity and Behavior)   C-SSRS Suicidal Ideation Intensity (with respect to the most severe ideation 1-5 identified above) Month   Frequency  How many times have you had these thoughts?  0   Duration  When you have the thoughts how long do they last? 0   Controllability  Could/can you stop thinking about killing yourself or wanting to die if you want to? 0   Deterrents  Are there things - anyone or anything (e.g., family, religion, pain of death) - that stopped you from wanting to die or acting on thoughts of suicide? 0   Reasons for Ideation  What sort of reasons did you have for thinking about wanting to die or killing yourself?  Was it to end the pain  or stop the way you were feeling (in other words you couldn't go on living with this pain or how you were feeling) or was it to get attention, revenge or a reaction from others? Or both? 0   Total Score                    0         Step 4: Guidelines to Determine Level of Risk and Develop Interventions to LOWER Risk Level  "The estimation of suicide risk, at the culmination of the suicide assessment, is the quintessential clinical judgment, since no study has identified one specific risk factor or set of risk factors as specifically predictive of suicide or other suicidal behavior."   From The American Psychiatric Association Practice Guidelines for the Assessment and Treatment of Patients with Suicidal Behaviors, page 24.   RISK STRATIFICATION TRIAGE   High Suicide Risk  Suicidal ideation with intent or intent with plan in past month (C-SSRS Suicidal Ideation #4 or #5)  Or  Suicidal behavior within past 3 months (C-SSRS Suicidal Behavior)   Initiate local psychiatric admission process  Stay with patient until transfer to higher level of care is complete  Follow-up and document outcome of emergency  psychiatric evaluation     Moderate Suicide Risk  Suicidal ideation with method, WITHOUT plan, intent or behavior       in past month (C-SSRS Suicidal Ideation #3)  Or  Suicidal behavior more than 3 months ago (C-SSRS Suicidal Behavior Lifetime)  Or  Multiple risk factors and few protective factors Directly address suicide risk, implementing suicide     prevention strategies  Develop Safety Plan     Low Suicide Risk  Wish to die or Suicidal Ideation WITHOUT method, intent, plan or behavior (C-SSRS Suicidal Ideation #1 or #2)   Or  Modifiable risk factors and strong protective factors  Or  No reported history of Suicidal Ideation or Behavior  Discretionary Outpatient Referral     Step 5: Documentation   Risk Level :  Low Suicide Risk:  Discretionary Outpatient Referral     Clinical Note:    Pt attempted suicide 9 years ago. No current suicidal ideations. Pt now at risk for suicide. Will discuss findings with provider.    Your Clinical Observation  Relevant Mental Status Information  Methods of Suicide Risk Evaluation    Brief Evaluation Summary  Warning Signs  Risk Indicators  Protective Factors  Access to Lethal Means  Collateral Sources Used and Relevant Information Obtained  Specific Assessment Data to Support Risk Determination  Rationale for Actions Taken and Not Taken    Provision of Crisis Line 1-800-273-TALK(8255)  Implementation of Safety Plan (If Applicable)
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
## 2021-10-26 NOTE — Care Management Notes (Signed)
SW got call from front desk. Pt was asking for a ride and place to stay. Explained to front desk pt was D/C planned hours ago, given FACT tokens to get to shelter and then given more tokens to get her back to [facility] in the AM. There are no more services that we have available now. Shelter is now full.Wendee Copp, SOCIAL WORKER  10/26/2021, 20:08

## 2021-10-26 NOTE — Care Management Notes (Addendum)
Pt being discharged. SW called case Freight forwarder at [employer] to request them to pick up patient to go gather her belongings and take her to the shelter. Case manager states that her last coverage date was yesterday and that patient has been "officially discharged." SW asked if they planned to return patient's belongings. Case manager said she wasn't sure, she will speak to supervisor and reach back out.   Johney Perotti [place], [place]  10/26/2021, 13:02        SW provided patient with FACT tokens and the phone number for [facility]. Advised that she go to the waiting room and call facility to arrange how she will get her belongings. Pt states she may take FACT bus to retrieve her belongings at facility and then FACT bus to shelter. No further needs at this time.   Kahleb Mcclane [place], [place]  10/26/2021, 13:24

## 2021-10-26 NOTE — ED Nurses Note (Signed)
SS involved for D/C plan. Verbalized understanding of DC instructions

## 2021-10-27 LAB — URINE CULTURE,ROUTINE: URINE CULTURE: 15000

## 2021-10-31 ENCOUNTER — Ambulatory Visit (HOSPITAL_COMMUNITY): Payer: Self-pay | Admitting: PULMONARY DISEASE

## 2021-10-31 ENCOUNTER — Ambulatory Visit (INDEPENDENT_AMBULATORY_CARE_PROVIDER_SITE_OTHER): Payer: Self-pay | Admitting: PHYSICIAN ASSISTANT

## 2021-11-04 ENCOUNTER — Encounter (INDEPENDENT_AMBULATORY_CARE_PROVIDER_SITE_OTHER): Payer: Self-pay | Admitting: Otolaryngology

## 2021-11-04 ENCOUNTER — Other Ambulatory Visit: Payer: Self-pay

## 2021-11-04 ENCOUNTER — Ambulatory Visit (INDEPENDENT_AMBULATORY_CARE_PROVIDER_SITE_OTHER): Payer: 59 | Admitting: Otolaryngology

## 2021-11-04 VITALS — Temp 97.2°F | Ht 61.0 in | Wt 115.8 lb

## 2021-11-04 DIAGNOSIS — H6980 Other specified disorders of Eustachian tube, unspecified ear: Secondary | ICD-10-CM

## 2021-11-04 DIAGNOSIS — H9190 Unspecified hearing loss, unspecified ear: Secondary | ICD-10-CM

## 2021-11-04 NOTE — Progress Notes (Signed)
[facility]  [address]  [place] [id number]  [phone]    PATIENT NAME:  Arlyn Yoke  MRN:  [id number]  DOB:  03-Sep-1965  DATE OF SERVICE: 11/04/2021    Chief Complaint:  Hearing Loss      HPI:  Estella Monteiro is a 56 y.o. female presenting as a new patient for evaluation of hearing loss. Patient notes family hx of hearing loss. She states that she has been hard of hearing for some time. Otherwise, no other complaints.    Past Medical History:  Past Medical History:   Diagnosis Date   . Alcohol abuse    . COPD (chronic obstructive pulmonary disease) (CMS HCC)    . Drug abuse (CMS [facility])          Past Surgical History:  Past Surgical History:   Procedure Laterality Date   . GASTROSCOPY WITH FEEDING TUBE PLACEMENT           Family History:  Family Medical History:    None         Social History:  Social History     Tobacco Use   Smoking Status Every Day   . Packs/day: 1.00   . Types: Cigarettes   Smokeless Tobacco Never     Social History     Substance and Sexual Activity   Alcohol Use Not Currently     Social History     Occupational History   . Not on file       Medications:  Outpatient Medications Marked as Taking for the 11/04/21 encounter (Office Visit) with Concha Norway, MD   Medication Sig   . benztropine (COGENTIN) 1 mg Oral Tablet Take 1 Tablet (1 mg total) by mouth Twice daily   . busPIRone (BUSPAR) 30 mg Oral Tablet Take 1 Tablet (30 mg total) by mouth Once a day   . esomeprazole magnesium (NEXIUM) 40 mg Oral Capsule, Delayed Release(E.C.) Take 1 Capsule (40 mg total) by mouth Every morning before breakfast   . fluconazole (DIFLUCAN) 100 mg Oral Tablet Take 1 Tablet (100 mg total) by mouth Once a day   . folic acid (FOLVITE) 1 mg Oral Tablet Take 1 Tablet (1 mg total) by mouth Once a day   . gabapentin (NEURONTIN) 300 mg Oral Capsule Take 400 mg by mouth   . Ibuprofen (MOTRIN) 600 mg Oral Tablet Take 1 Tablet (600 mg total) by mouth Four times a day as needed for  Pain   . methocarbamoL (ROBAXIN) 500 mg Oral Tablet Take 1 Tablet (500 mg total) by mouth Four times a day   . prazosin (MINIPRESS) 1 mg Oral Capsule Take 1 Capsule (1 mg total) by mouth Twice daily   . risperiDONE (RISPERDAL) 1 mg Oral Tablet Take 1 Tablet (1 mg total) by mouth Twice daily   . venlafaxine (EFFEXOR) 75 mg Oral Tablet Take 1 Tablet (75 mg total) by mouth       Allergies:  No Known Allergies    Review of Systems:  Do you have any fevers: no   Any weight change: no   Change in your vision: no    Chest Pain: no   Shortness of Breath: no   Stomach pain: no   Urinary difficulity: no   Joint Pain: no   Skin Problems: no   Weakness or Numbness: no   Easy Bruising or Bleeding: no   Excessive Thirst: no   Seasonal Allergies: no    All other  systems reviewed and found to be negative.    Physical Exam:  Temperature 36.2 C (97.2 F), temperature source Thermal Scan, height 1.549 m (5\' 1" ), weight 52.5 kg (115 lb 12.8 oz).  Body mass index is 21.88 kg/m.  General Appearance: Pleasant, cooperative, healthy, and in no acute distress.  Eyes: Conjunctivae/corneas clear, PERRLA, EOM's intact.  Head and Face: Normocephalic, atraumatic.  Face symmetric, no obvious lesions.   Right Ear:       Pinnae: Normal shape and position.        External auditory canals:  Patent without inflammation.       Tympanic membranes:  Intact, translucent, midposition, middle ear aerated.  Left Ear:       Pinnae: Normal shape and position.        External auditory canals:  Patent without inflammation.       Tympanic membranes:  Intact, translucent, midposition, middle ear aerated.  Nose: External pyramid midline. Mucosa normal. No purulence, polyps, or crusts.   Oral Cavity/Oropharynx: No mucosal lesions, masses, or pharyngeal asymmetry.  Tonsils: Deferred.  Nasopharynx: Deferred.  Hypopharynx/Larynx: Clear on mirror exam.  Neck: no cervical adenopathy, no palpable thyroid or salivary gland masses  Thyroid: no significant thyroid  abnormality by palpation  Cardiovascular: Good perfusion of upper extremities.  No cyanosis of the hands or fingers.  Lungs: No apparent stridorous breathing. No acute distress.  Skin: Skin warm and dry.  Neurologic: Cranial nerves:  grossly intact.  Psychiatric: Alert and oriented x 3.    Procedure:       Data Reviewed:    Audiogram performed today (11/04/2021) in clinic: Moderate hearing loss.  Tympanogram: Type A, AS. Type A, AD.      Assessment:  1. Moderate hearing loss    Plan:  Orders Placed This Encounter   . [id number] - PURE TONE AUDIOMETRY (THRESHOLD); AIR & BONE (AMB ONLY)   . [id number] - TYMPANOMETRY (IMPEDANCE TESTING) (AMB ONLY)     1. Audiogram performed in clinic today.  2. Noise precautions. Avoid high noise level environments.  3. Wear hearing protection when needed.  4. F/U PRN for reevaluation.    Concha Norway, MD  Department of Otolaryngology  [facility]    PCP: Lovie Chol, DO  [place] [address]   REF: No referring provider defined for this encounter.     I am scribing for, and in the presence of, Dr. Whitney Post for services provided on 11/04/2021.  Celedonio Miyamoto, SCRIBE  [place], [place],  11/04/2021, 16:02.  I personally performed the services described in this documentation, as scribed  in my presence, and it is both accurate  and complete.    Concha Norway, MD

## 2022-09-27 ENCOUNTER — Other Ambulatory Visit (HOSPITAL_BASED_OUTPATIENT_CLINIC_OR_DEPARTMENT_OTHER): Payer: Self-pay

## 2022-09-27 ENCOUNTER — Emergency Department (HOSPITAL_BASED_OUTPATIENT_CLINIC_OR_DEPARTMENT_OTHER)
Admission: EM | Admit: 2022-09-27 | Discharge: 2022-09-27 | Disposition: A | Discharge status: Home / Self Care | Payer: Medicaid Other | Attending: Emergency Medicine | Admitting: Emergency Medicine

## 2022-09-27 ENCOUNTER — Emergency Department (HOSPITAL_BASED_OUTPATIENT_CLINIC_OR_DEPARTMENT_OTHER): Payer: Medicaid Other

## 2022-09-27 ENCOUNTER — Encounter (HOSPITAL_BASED_OUTPATIENT_CLINIC_OR_DEPARTMENT_OTHER): Payer: Self-pay

## 2022-09-27 DIAGNOSIS — F17200 Nicotine dependence, unspecified, uncomplicated: Secondary | ICD-10-CM | POA: Diagnosis not present

## 2022-09-27 DIAGNOSIS — R911 Solitary pulmonary nodule: Secondary | ICD-10-CM

## 2022-09-27 DIAGNOSIS — R072 Precordial pain: Secondary | ICD-10-CM

## 2022-09-27 DIAGNOSIS — J449 Chronic obstructive pulmonary disease, unspecified: Secondary | ICD-10-CM | POA: Insufficient documentation

## 2022-09-27 DIAGNOSIS — R0602 Shortness of breath: Secondary | ICD-10-CM | POA: Diagnosis not present

## 2022-09-27 DIAGNOSIS — Z8709 Personal history of other diseases of the respiratory system: Secondary | ICD-10-CM

## 2022-09-27 LAB — CBC
HCT: 45.3 % (ref 36.0–46.0)
Hemoglobin: 15 g/dL (ref 12.0–15.0)
MCH: 31.8 pg (ref 26.0–34.0)
MCHC: 33.1 g/dL (ref 30.0–36.0)
MCV: 96.2 fL (ref 80.0–100.0)
Platelets: 349 10*3/uL (ref 150–400)
RBC: 4.71 MIL/uL (ref 3.87–5.11)
RDW: 13.9 % (ref 11.5–15.5)
WBC: 7.5 10*3/uL (ref 4.0–10.5)
nRBC: 0 % (ref 0.0–0.2)

## 2022-09-27 LAB — BASIC METABOLIC PANEL
Anion gap: 7 (ref 5–15)
BUN: 13 mg/dL (ref 6–20)
CO2: 27 mmol/L (ref 22–32)
Calcium: 8.7 mg/dL — ABNORMAL LOW (ref 8.9–10.3)
Chloride: 100 mmol/L (ref 98–111)
Creatinine, Ser: 0.82 mg/dL (ref 0.44–1.00)
GFR, Estimated: >60 mL/min (ref >60)
Glucose, Bld: 88 mg/dL (ref 70–99)
Potassium: 4.2 mmol/L (ref 3.5–5.1)
Sodium: 134 mmol/L — ABNORMAL LOW (ref 135–145)

## 2022-09-27 LAB — TROPONIN I (HIGH SENSITIVITY)
Troponin I (High Sensitivity): <2 ng/L (ref <18)
Troponin I (High Sensitivity): 2 ng/L (ref <18)

## 2022-09-27 MED ORDER — IOHEXOL 350 MG/ML SOLN
100.0000 mL | Freq: Once | INTRAVENOUS | Status: AC | PRN
  Administered 2022-09-27: 75 mL via INTRAVENOUS

## 2022-09-27 MED ORDER — ALBUTEROL SULFATE HFA 108 (90 BASE) MCG/ACT IN AERS
1.0000 | INHALATION_SPRAY | Freq: Four times a day (QID) | RESPIRATORY_TRACT | 1 refills | Status: DC | PRN
  Filled 2022-09-27: qty 6.7, 25d supply, fill #0

## 2022-09-27 NOTE — ED Triage Notes (Signed)
C/o constant chest pain radiating to back with shortness of breath x 1 week, also c/o productive cough.  Is trying to get into [facility], they're wanting her cleared medically before accepting her. Was recently admitted in [place] for Detox.

## 2022-09-27 NOTE — Discharge Instructions (Addendum)
Recommending close follow-up of pulmonary nodules probably his CT scan repeat in 3 months.  Cardiac workup without any acute findings.  Use the albuterol inhaler 2 puffs every 6 hours as needed for your COPD.  Medically cleared from chest pain standpoint.

## 2022-09-27 NOTE — ED Provider Notes (Addendum)
[facility] Provider Note   CSN: [id number] Arrival date & time: 09/27/22  1143     History  Chief Complaint  Patient presents with   Chest Pain    Lauren Blackwell is a 57 y.o. female.  Patient with a complaint of substernal chest pain radiating to between the shoulder blades for a week.  Patient just got out of detox was morning.  Detox center told her that her heart rate was elevated.  Heart rate here is in the 80s.  Cardiac monitoring without any arrhythmias.  Patient states that the chest discomfort and back discomfort associated with shortness of breath.  Denies any leg swelling.  Past medical history sniffing for COPD history of eye droop drug abuse alcohol abuse neuro bowel syndrome.  Patient is an everyday smoker.       Home Medications Prior to Admission medications   Medication Sig Start Date End Date Taking? Authorizing Provider  acetaminophen (TYLENOL) 500 MG tablet Take 1,000 mg by mouth every 6 (six) hours as needed for moderate pain or headache.    [provider]      Allergies    Patient has no known allergies.    Review of Systems   Review of Systems  Constitutional:  Negative for chills and fever.  HENT:  Negative for ear pain and sore throat.   Eyes:  Negative for pain and visual disturbance.  Respiratory:  Positive for shortness of breath. Negative for cough.   Cardiovascular:  Positive for chest pain. Negative for palpitations and leg swelling.  Gastrointestinal:  Negative for abdominal pain and vomiting.  Genitourinary:  Negative for dysuria and hematuria.  Musculoskeletal:  Negative for arthralgias and back pain.  Skin:  Negative for color change and rash.  Neurological:  Negative for seizures and syncope.  All other systems reviewed and are negative.   Physical Exam Updated Vital Signs BP 115/79 (BP Location: Left Arm)   Pulse 83   Temp 98 F (36.7 C) (Oral)   Resp 15   Ht 1.549 m (5'  1")   Wt 52.6 kg   SpO2 97%   BMI 21.92 kg/m  Physical Exam Vitals and nursing note reviewed.  Constitutional:      General: She is not in acute distress.    Appearance: Normal appearance. She is well-developed. She is not ill-appearing.  HENT:     Head: Normocephalic and atraumatic.  Eyes:     Extraocular Movements: Extraocular movements intact.     Conjunctiva/sclera: Conjunctivae normal.     Pupils: Pupils are equal, round, and reactive to light.  Cardiovascular:     Rate and Rhythm: Normal rate and regular rhythm.     Heart sounds: No murmur heard. Pulmonary:     Effort: Pulmonary effort is normal. No respiratory distress.     Breath sounds: Normal breath sounds. No wheezing, rhonchi or rales.  Abdominal:     Palpations: Abdomen is soft.     Tenderness: There is no abdominal tenderness.  Musculoskeletal:        General: No swelling.     Cervical back: Normal range of motion and neck supple.     Right lower leg: No edema.     Left lower leg: No edema.  Skin:    General: Skin is warm and dry.     Capillary Refill: Capillary refill takes less than 2 seconds.  Neurological:     General: No focal deficit present.  Mental Status: She is alert and oriented to person, place, and time.     Cranial Nerves: No cranial nerve deficit.     Sensory: No sensory deficit.     Motor: No weakness.  Psychiatric:        Mood and Affect: Mood normal.     ED Results / Procedures / Treatments   Labs (all labs ordered are listed, but only abnormal results are displayed) Labs Reviewed  BASIC METABOLIC PANEL - Abnormal; Notable for the following components:      Result Value   Sodium 134 (*)    Calcium 8.7 (*)    All other components within normal limits  CBC  TROPONIN I (HIGH SENSITIVITY)    EKG EKG Interpretation  Date/Time:  Wednesday September 27 2022 11:55:09 EDT Ventricular Rate:  84 PR Interval:  139 QRS Duration: 80 QT Interval:  383 QTC Calculation: 453 R  Axis:   45 Text Interpretation: Sinus rhythm Probable left atrial enlargement Confirmed by Vanetta Mulders [id number] on 09/27/2022 12:07:44 PM  Radiology DG Chest 2 View  Result Date: 09/27/2022 CLINICAL DATA:  Chest pain and shortness of breath. EXAM: CHEST - 2 VIEW COMPARISON:  07/12/2022 FINDINGS: Heart size and mediastinal contours are normal. No pleural fluid or airspace disease. Chronic, coarsened interstitial markings are identified bilaterally. No superimposed pleural effusion, interstitial edema or airspace disease. IMPRESSION: No active cardiopulmonary abnormalities. Electronically Signed   By: Signa Kell M.D.   On: 09/27/2022 12:37    Procedures Procedures    Medications Ordered in ED Medications  iohexol (OMNIPAQUE) 350 MG/ML injection 100 mL (has no administration in time range)    ED Course/ Medical Decision Making/ A&P                             Medical Decision Making Amount and/or Complexity of Data Reviewed Labs: ordered. Radiology: ordered.  Risk Prescription drug management.   Patient CBC without any abnormalities.  Basic metabolic panel significant for sodium of 134 renal function normal.  Initial troponin is less than 2 delta troponin pending.  Chest x-ray no active cardiopulmonary abnormalities.  EKG without any significant arrhythmias cardiac monitoring without arrhythmias.  Patient appears very comfortable.  Lungs are clear no wheezing despite having history of COPD.  Oxygen saturation 97% on room air.   Delta troponin pending.  Patient given albuterol inhaler to use.  Disposition based on delta troponin.  Should be back soon.  Delta troponin is 2.  No significant increase in troponin.  Patient stable for discharge home.  Final Clinical Impression(s) / ED Diagnoses Final diagnoses:  Precordial pain  Shortness of breath  History of COPD    Rx / DC Orders ED Discharge Orders     None         Vanetta Mulders, MD 09/27/22 1305     Vanetta Mulders, MD 09/27/22 1308    Vanetta Mulders, MD 09/27/22 [phone]

## 2022-10-05 ENCOUNTER — Other Ambulatory Visit (HOSPITAL_BASED_OUTPATIENT_CLINIC_OR_DEPARTMENT_OTHER): Payer: Self-pay

## 2022-11-20 ENCOUNTER — Other Ambulatory Visit (HOSPITAL_COMMUNITY)
Admission: RE | Admit: 2022-11-20 | Discharge: 2022-11-20 | Disposition: A | Discharge status: Home / Self Care | Payer: Medicaid Other | Source: Ambulatory Visit | Attending: Physician Assistant | Admitting: Physician Assistant

## 2022-11-20 ENCOUNTER — Encounter: Payer: Self-pay | Admitting: Physician Assistant

## 2022-11-20 ENCOUNTER — Ambulatory Visit: Payer: Medicaid Other | Admitting: Physician Assistant

## 2022-11-20 VITALS — BP 108/74 | HR 96 | Ht 59.0 in | Wt 114.0 lb

## 2022-11-20 DIAGNOSIS — F17210 Nicotine dependence, cigarettes, uncomplicated: Secondary | ICD-10-CM

## 2022-11-20 DIAGNOSIS — F1010 Alcohol abuse, uncomplicated: Secondary | ICD-10-CM

## 2022-11-20 DIAGNOSIS — N3000 Acute cystitis without hematuria: Secondary | ICD-10-CM

## 2022-11-20 DIAGNOSIS — R252 Cramp and spasm: Secondary | ICD-10-CM

## 2022-11-20 DIAGNOSIS — N898 Other specified noninflammatory disorders of vagina: Secondary | ICD-10-CM

## 2022-11-20 DIAGNOSIS — F3178 Bipolar disorder, in full remission, most recent episode mixed: Secondary | ICD-10-CM

## 2022-11-20 DIAGNOSIS — R300 Dysuria: Secondary | ICD-10-CM

## 2022-11-20 DIAGNOSIS — Z720 Tobacco use: Secondary | ICD-10-CM

## 2022-11-20 DIAGNOSIS — Z113 Encounter for screening for infections with a predominantly sexual mode of transmission: Secondary | ICD-10-CM

## 2022-11-20 DIAGNOSIS — B9689 Other specified bacterial agents as the cause of diseases classified elsewhere: Secondary | ICD-10-CM

## 2022-11-20 DIAGNOSIS — I700 Atherosclerosis of aorta: Secondary | ICD-10-CM

## 2022-11-20 DIAGNOSIS — F411 Generalized anxiety disorder: Secondary | ICD-10-CM

## 2022-11-20 DIAGNOSIS — J438 Other emphysema: Secondary | ICD-10-CM

## 2022-11-20 DIAGNOSIS — F1510 Other stimulant abuse, uncomplicated: Secondary | ICD-10-CM

## 2022-11-20 DIAGNOSIS — F1410 Cocaine abuse, uncomplicated: Secondary | ICD-10-CM

## 2022-11-20 DIAGNOSIS — R911 Solitary pulmonary nodule: Secondary | ICD-10-CM

## 2022-11-20 DIAGNOSIS — N760 Acute vaginitis: Secondary | ICD-10-CM

## 2022-11-20 DIAGNOSIS — E559 Vitamin D deficiency, unspecified: Secondary | ICD-10-CM

## 2022-11-20 LAB — POCT URINALYSIS DIP (CLINITEK)
Bilirubin, UA: NEGATIVE
Glucose, UA: NEGATIVE mg/dL
Ketones, POC UA: NEGATIVE mg/dL
Nitrite, UA: NEGATIVE
POC PROTEIN,UA: NEGATIVE
Spec Grav, UA: 1.025 (ref 1.010–1.025)
Urobilinogen, UA: 0.2 E.U./dL
pH, UA: 6 (ref 5.0–8.0)

## 2022-11-20 MED ORDER — NICOTINE 21 MG/24HR TD PT24
21.0000 mg | MEDICATED_PATCH | Freq: Every day | TRANSDERMAL | 0 refills | Status: DC
Start: 2022-11-20 — End: 2023-01-11

## 2022-11-20 MED ORDER — BUDESONIDE-FORMOTEROL FUMARATE 160-4.5 MCG/ACT IN AERO
2.0000 | INHALATION_SPRAY | Freq: Two times a day (BID) | RESPIRATORY_TRACT | 1 refills | Status: DC
Start: 2022-11-20 — End: 2023-01-22

## 2022-11-20 MED ORDER — NITROFURANTOIN MONOHYD MACRO 100 MG PO CAPS
100.0000 mg | ORAL_CAPSULE | Freq: Two times a day (BID) | ORAL | 0 refills | Status: AC
Start: 2022-11-20 — End: 2022-11-25

## 2022-11-20 MED ORDER — ALBUTEROL SULFATE HFA 108 (90 BASE) MCG/ACT IN AERS: 1.0000 | INHALATION_SPRAY | Freq: Four times a day (QID) | RESPIRATORY_TRACT | 1 refills | Status: DC | PRN

## 2022-11-20 NOTE — Progress Notes (Unsigned)
New Patient Office Visit  Subjective    Patient ID: Lauren Blackwell, female    DOB: [birth year]  Age: 57 y.o. MRN: [id number]  CC:  Chief Complaint  Patient presents with   Vaginal Discharge    Yellowish, white discharge    Dysuria    Vaginal itching, Pelvic pain, vaginal dryness     HPI [name] states that she is currently being treated for substance abuse at [facility] residential treatment center.  States that she arrived on May 28 and is planning on going to caring services in [facility] after she completes her program.  States that she has been experiencing a yellowish-white vaginal discharge for the past 5 to 6 weeks.  Describes it as thin, no odor.  States that she previously had some itching but that has resolved.  States that she has recently started experiencing dysuria, a feeling of burning in her bladder, bilateral lower back pain and suprapubic discomfort.  States that has been ongoing for the past week.  Denies fever, nausea or vomiting.  States that she has been having to urinate more often.  States that she has not tried anything for relief.  States that she is unsure if she has been exposed to an STI, states that she has not had any type of testing but has had unprotected intercourse.  States that she has been experiencing cramping in both of her legs, states it mostly happens at night.  States that she does have a history of low potassium.  States that she has been drinking approximately 8 cups of water a day.  States that she has a history of emphysema.  States that she was told she has a nodule on her lung that needs to be followed.  States that she uses Symbicort but does request a prescription for her albuterol inhaler.  IMPRESSION: 1. No pulmonary emboli. 2. Emphysema, upper lobe predominant. Bullous disease also in the right middle lobe anteriorly. 3. 1.6 cm well-circumscribed pulmonary nodule in the posterior right lower lobe. This is low-density with  Hounsfield units of 6, therefore likely benign. Consider one of the following in 3 months for both low-risk and high-risk individuals: (a) repeat chest CT, (b) follow-up PET-CT, or (c) tissue sampling. Repeat chest CT would be favored given the particular details of this case. This recommendation follows the consensus statement: Guidelines for Management of Incidental Pulmonary Nodules Detected on CT Images: From the Fleischner Society 2017; Radiology 2017; 284:228-243. 4. Aortic atherosclerosis. Coronary artery calcification. 5. Partial compression fractures at the inferior endplate of T2, T5 and T6. These are age indeterminate, but in particular the T2 and T6 fractures could be recent. Additionally, there are late subacute healing rib fractures on the right in the lower lateral chest.   Aortic Atherosclerosis (ICD10-I70.0) and Emphysema (ICD10-J43.9).     Electronically Signed   By: Paulina Fusi M.D.   On: 09/27/2022 13:44   Outpatient Encounter Medications as of 11/20/2022  Medication Sig   DULoxetine (CYMBALTA) 30 MG capsule Take 30 mg by mouth daily.   gabapentin (NEURONTIN) 300 MG capsule Take 300 mg by mouth 3 (three) times daily.   nicotine (NICODERM CQ - DOSED IN MG/24 HOURS) 21 mg/24hr patch Place 1 patch (21 mg total) onto the skin daily.   nitrofurantoin, macrocrystal-monohydrate, (MACROBID) 100 MG capsule Take 1 capsule (100 mg total) by mouth 2 (two) times daily for 5 days.   [DISCONTINUED] budesonide-formoterol (SYMBICORT) 160-4.5 MCG/ACT inhaler Inhale 2 puffs into the lungs 2 (  two) times daily.   acetaminophen (TYLENOL) 500 MG tablet Take 1,000 mg by mouth as needed for moderate pain or headache.   albuterol (VENTOLIN HFA) 108 (90 Base) MCG/ACT inhaler Inhale 1-2 puffs into the lungs every 6 (six) hours as needed for wheezing or shortness of breath.   budesonide-formoterol (SYMBICORT) 160-4.5 MCG/ACT inhaler Inhale 2 puffs into the lungs 2 (two) times daily.    [DISCONTINUED] albuterol (VENTOLIN HFA) 108 (90 Base) MCG/ACT inhaler Inhale 1-2 puffs into the lungs every 6 (six) hours as needed for wheezing or shortness of breath.   No facility-administered encounter medications on file as of 11/20/2022.    Past Medical History:  Diagnosis Date   COPD (chronic obstructive pulmonary disease) (HCC)    IBS (irritable bowel syndrome)    IV drug abuse (HCC)    Heroin    No past surgical history on file.  No family history on file.  Social History   Socioeconomic History   Marital status: Single    Spouse name: Not on file   Number of children: Not on file   Years of education: Not on file   Highest education level: Not on file  Occupational History   Not on file  Tobacco Use   Smoking status: Every Day    Types: Cigarettes   Smokeless tobacco: Never  Vaping Use   Vaping Use: Never used  Substance and Sexual Activity   Alcohol use: Not Currently   Drug use: Not Currently    Types: IV, Methamphetamines    Comment: Heroin   Sexual activity: Not on file  Other Topics Concern   Not on file  Social History Narrative   Not on file   Social Determinants of Health   Financial Resource Strain: Not on file  Food Insecurity: Not on file  Transportation Needs: Not on file  Physical Activity: Not on file  Stress: Not on file  Social Connections: Not on file  Intimate Partner Violence: Not on file    Review of Systems  Constitutional:  Negative for chills and fever.  HENT: Negative.    Eyes: Negative.   Respiratory:  Negative for shortness of breath.   Cardiovascular:  Negative for chest pain.  Gastrointestinal:  Positive for abdominal pain. Negative for constipation, diarrhea, nausea and vomiting.  Genitourinary:  Positive for dysuria and frequency. Negative for hematuria.  Musculoskeletal:  Positive for back pain.  Skin: Negative.   Neurological: Negative.   Endo/Heme/Allergies: Negative.   Psychiatric/Behavioral: Negative.           Objective    BP 108/74 (BP Location: Left Arm, Patient Position: Sitting, Cuff Size: Large)   Pulse 96   Ht 4\' 11"  (1.499 m)   Wt 114 lb (51.7 kg)   SpO2 93%   BMI 23.03 kg/m   Physical Exam Vitals and nursing note reviewed.  Constitutional:      Appearance: Normal appearance.  HENT:     Head: Normocephalic and atraumatic.     Right Ear: External ear normal.     Left Ear: External ear normal.     Nose: Nose normal.     Mouth/Throat:     Mouth: Mucous membranes are moist.     Pharynx: Oropharynx is clear.  Eyes:     Extraocular Movements: Extraocular movements intact.     Conjunctiva/sclera: Conjunctivae normal.     Pupils: Pupils are equal, round, and reactive to light.  Cardiovascular:     Rate and Rhythm: Normal rate and regular  rhythm.     Pulses: Normal pulses.     Heart sounds: Normal heart sounds.  Pulmonary:     Effort: Pulmonary effort is normal.     Breath sounds: Normal breath sounds.  Abdominal:     Tenderness: There is abdominal tenderness in the suprapubic area. There is right CVA tenderness and left CVA tenderness.  Musculoskeletal:        General: Normal range of motion.     Cervical back: Normal range of motion and neck supple.  Skin:    General: Skin is warm and dry.  Neurological:     General: No focal deficit present.     Mental Status: She is alert and oriented to person, place, and time.  Psychiatric:        Mood and Affect: Mood normal.        Behavior: Behavior normal.        Thought Content: Thought content normal.        Judgment: Judgment normal.       Assessment & Plan:   Problem List Items Addressed This Visit       Cardiovascular and Mediastinum   Aortic atherosclerosis (HCC)     Respiratory   Other emphysema (HCC)   Relevant Medications   nicotine (NICODERM CQ - DOSED IN MG/24 HOURS) 21 mg/24hr patch   budesonide-formoterol (SYMBICORT) 160-4.5 MCG/ACT inhaler   albuterol (VENTOLIN HFA) 108 (90 Base) MCG/ACT  inhaler   Right lower lobe pulmonary nodule     Other   GAD (generalized anxiety disorder)   Bipolar disorder, in full remission, most recent episode mixed (HCC)   Relevant Orders   Vitamin D, 25-hydroxy (Completed)   Comp. Metabolic Panel (12) (Completed)   Tobacco abuse   Relevant Medications   nicotine (NICODERM CQ - DOSED IN MG/24 HOURS) 21 mg/24hr patch   Alcohol abuse   Cocaine abuse (HCC)   Methamphetamine abuse (HCC)   Other Visit Diagnoses     Acute cystitis without hematuria    -  Primary   Relevant Medications   nitrofurantoin, macrocrystal-monohydrate, (MACROBID) 100 MG capsule   Other Relevant Orders   Urine Culture   POCT URINALYSIS DIP (CLINITEK) (Completed)   Vaginal discharge       Relevant Orders   Cervicovaginal ancillary only   Leg cramping       Relevant Orders   CBC with Differential/Platelet (Completed)   TSH (Completed)   Screening examination for STI       Relevant Orders   RPR (Completed)   HIV antibody (with reflex) (Completed)   HCV Ab w Reflex to Quant PCR (Completed)      1. Acute cystitis without hematuria Trial Macrobid.  Patient treated based on clinical presentation.  Patient education given on supportive care.  Red flags given for prompt reevaluation - nitrofurantoin, macrocrystal-monohydrate, (MACROBID) 100 MG capsule; Take 1 capsule (100 mg total) by mouth 2 (two) times daily for 5 days.  Dispense: 10 capsule; Refill: 0 - Urine Culture - POCT URINALYSIS DIP (CLINITEK)  2. Vaginal discharge  - Cervicovaginal ancillary only  3. Leg cramping Patient education given on supportive care - CBC with Differential/Platelet - TSH  4. Other emphysema (HCC) Continue current regimen,resume albuterol inhaler as needed - budesonide-formoterol (SYMBICORT) 160-4.5 MCG/ACT inhaler; Inhale 2 puffs into the lungs 2 (two) times daily.  Dispense: 1 each; Refill: 1 - albuterol (VENTOLIN HFA) 108 (90 Base) MCG/ACT inhaler; Inhale 1-2 puffs into the  lungs every 6 (six) hours as  needed for wheezing or shortness of breath.  Dispense: 6.7 g; Refill: 1  5. Right lower lobe pulmonary nodule Patient requires repeat CT chest in July 2024.  Patient to follow-up with mobile unit in 3 weeks.  Will order at that time  6. Aortic atherosclerosis (HCC)   7. Bipolar disorder, in full remission, most recent episode mixed [facility]) Managed by in-house psychiatry at [facility] residential treatment center - Vitamin D, 25-hydroxy - Comp. Metabolic Panel (12)  8. GAD (generalized anxiety disorder)   9. Tobacco abuse Continue current regimen - nicotine (NICODERM CQ - DOSED IN MG/24 HOURS) 21 mg/24hr patch; Place 1 patch (21 mg total) onto the skin daily.  Dispense: 28 patch; Refill: 0  10. Screening examination for STI  - RPR - HIV antibody (with reflex) - HCV Ab w Reflex to Quant PCR  11. Alcohol abuse Currently in substance abuse treatment program  12. Methamphetamine abuse (HCC)   13. Cocaine abuse (HCC)    I have reviewed the patient's medical history (PMH, PSH, Social History, Family History, Medications, and allergies) , and have been updated if relevant. I spent 40 minutes reviewing chart and  face to face time with patient.    Return in about 3 weeks (around 12/11/2022).   Kasandra Knudsen Mayers, PA-C

## 2022-11-20 NOTE — Patient Instructions (Signed)
You are being treated for urinary tract infection, you are going to take Macrobid 100 mg twice a day for 5 days.  Continue to stay well-hydrated and get plenty of rest.  To help with your dry skin on your feet, I do encourage you to continue using lotion up to 3 times a day, consider putting heavy amount of lotion on your feet at bedtime and then putting socks on them to help moisturize while you sleep.  I sent a prescription for NicoDerm patches as well as your albuterol inhaler.  You will follow-up with the mobile unit in 2 weeks.  We will call you with today's lab results.  Roney Jaffe, PA-C Physician Assistant [facility] [email or web address]   Urinary Tract Infection, Adult  A urinary tract infection (UTI) is an infection of any part of the urinary tract. The urinary tract includes the kidneys, ureters, bladder, and urethra. These organs make, store, and get rid of urine in the body. An upper UTI affects the ureters and kidneys. A lower UTI affects the bladder and urethra. What are the causes? Most urinary tract infections are caused by bacteria in your genital area around your urethra, where urine leaves your body. These bacteria grow and cause inflammation of your urinary tract. What increases the risk? You are more likely to develop this condition if: You have a urinary catheter that stays in place. You are not able to control when you urinate or have a bowel movement (incontinence). You are female and you: Use a spermicide or diaphragm for birth control. Have low estrogen levels. Are pregnant. You have certain genes that increase your risk. You are sexually active. You take antibiotic medicines. You have a condition that causes your flow of urine to slow down, such as: An enlarged prostate, if you are female. Blockage in your urethra. A kidney stone. A nerve condition that affects your bladder control (neurogenic  bladder). Not getting enough to drink, or not urinating often. You have certain medical conditions, such as: Diabetes. A weak disease-fighting system (immunesystem). Sickle cell disease. Gout. Spinal cord injury. What are the signs or symptoms? Symptoms of this condition include: Needing to urinate right away (urgency). Frequent urination. This may include small amounts of urine each time you urinate. Pain or burning with urination. Blood in the urine. Urine that smells bad or unusual. Trouble urinating. Cloudy urine. Vaginal discharge, if you are female. Pain in the abdomen or the lower back. You may also have: Vomiting or a decreased appetite. Confusion. Irritability or tiredness. A fever or chills. Diarrhea. The first symptom in older adults may be confusion. In some cases, they may not have any symptoms until the infection has worsened. How is this diagnosed? This condition is diagnosed based on your medical history and a physical exam. You may also have other tests, including: Urine tests. Blood tests. Tests for STIs (sexually transmitted infections). If you have had more than one UTI, a cystoscopy or imaging studies may be done to determine the cause of the infections. How is this treated? Treatment for this condition includes: Antibiotic medicine. Over-the-counter medicines to treat discomfort. Drinking enough water to stay hydrated. If you have frequent infections or have other conditions such as a kidney stone, you may need to see a health care provider who specializes in the urinary tract (urologist). In rare cases, urinary tract infections can cause sepsis. Sepsis is a life-threatening condition that occurs when the body responds to an infection. Sepsis is treated in  the hospital with IV antibiotics, fluids, and other medicines. Follow these instructions at home:  Medicines Take over-the-counter and prescription medicines only as told by your health care  provider. If you were prescribed an antibiotic medicine, take it as told by your health care provider. Do not stop using the antibiotic even if you start to feel better. General instructions Make sure you: Empty your bladder often and completely. Do not hold urine for long periods of time. Empty your bladder after sex. Wipe from front to back after urinating or having a bowel movement if you are female. Use each tissue only one time when you wipe. Drink enough fluid to keep your urine pale yellow. Keep all follow-up visits. This is important. Contact a health care provider if: Your symptoms do not get better after 1-2 days. Your symptoms go away and then return. Get help right away if: You have severe pain in your back or your lower abdomen. You have a fever or chills. You have nausea or vomiting. Summary A urinary tract infection (UTI) is an infection of any part of the urinary tract, which includes the kidneys, ureters, bladder, and urethra. Most urinary tract infections are caused by bacteria in your genital area. Treatment for this condition often includes antibiotic medicines. If you were prescribed an antibiotic medicine, take it as told by your health care provider. Do not stop using the antibiotic even if you start to feel better. Keep all follow-up visits. This is important. This information is not intended to replace advice given to you by your health care provider. Make sure you discuss any questions you have with your health care provider. Document Revised: 01/11/2020 Document Reviewed: 01/16/2020 Elsevier Patient Education  2024 [employer].

## 2022-11-21 ENCOUNTER — Encounter: Payer: Self-pay | Admitting: Physician Assistant

## 2022-11-21 DIAGNOSIS — F1010 Alcohol abuse, uncomplicated: Secondary | ICD-10-CM | POA: Insufficient documentation

## 2022-11-21 DIAGNOSIS — J438 Other emphysema: Secondary | ICD-10-CM | POA: Insufficient documentation

## 2022-11-21 DIAGNOSIS — F1420 Cocaine dependence, uncomplicated: Secondary | ICD-10-CM

## 2022-11-21 DIAGNOSIS — F1410 Cocaine abuse, uncomplicated: Secondary | ICD-10-CM | POA: Insufficient documentation

## 2022-11-21 DIAGNOSIS — I700 Atherosclerosis of aorta: Secondary | ICD-10-CM | POA: Insufficient documentation

## 2022-11-21 DIAGNOSIS — F1510 Other stimulant abuse, uncomplicated: Secondary | ICD-10-CM | POA: Insufficient documentation

## 2022-11-21 DIAGNOSIS — Z720 Tobacco use: Secondary | ICD-10-CM | POA: Insufficient documentation

## 2022-11-21 DIAGNOSIS — F322 Major depressive disorder, single episode, severe without psychotic features: Secondary | ICD-10-CM | POA: Insufficient documentation

## 2022-11-21 DIAGNOSIS — F411 Generalized anxiety disorder: Secondary | ICD-10-CM | POA: Insufficient documentation

## 2022-11-21 DIAGNOSIS — F3178 Bipolar disorder, in full remission, most recent episode mixed: Secondary | ICD-10-CM | POA: Insufficient documentation

## 2022-11-21 DIAGNOSIS — R911 Solitary pulmonary nodule: Secondary | ICD-10-CM | POA: Insufficient documentation

## 2022-11-21 HISTORY — DX: Other stimulant abuse, uncomplicated: F15.10

## 2022-11-21 HISTORY — DX: Cocaine dependence, uncomplicated: F14.20

## 2022-11-21 LAB — CBC WITH DIFFERENTIAL/PLATELET
Basophils Absolute: 0.1 10*3/uL (ref 0.0–0.2)
Basos: 1 %
EOS (ABSOLUTE): 0.3 10*3/uL (ref 0.0–0.4)
Eos: 3 %
Hematocrit: 44.6 % (ref 34.0–46.6)
Hemoglobin: 14.8 g/dL (ref 11.1–15.9)
Immature Grans (Abs): 0 10*3/uL (ref 0.0–0.1)
Immature Granulocytes: 0 %
Lymphocytes Absolute: 3 10*3/uL (ref 0.7–3.1)
Lymphs: 32 %
MCH: 32 pg (ref 26.6–33.0)
MCHC: 33.2 g/dL (ref 31.5–35.7)
MCV: 96 fL (ref 79–97)
Monocytes Absolute: 0.7 10*3/uL (ref 0.1–0.9)
Monocytes: 8 %
Neutrophils Absolute: 5.2 10*3/uL (ref 1.4–7.0)
Neutrophils: 56 %
Platelets: 341 10*3/uL (ref 150–450)
RBC: 4.63 x10E6/uL (ref 3.77–5.28)
RDW: 13 % (ref 11.7–15.4)
WBC: 9.2 10*3/uL (ref 3.4–10.8)

## 2022-11-21 LAB — CERVICOVAGINAL ANCILLARY ONLY
Bacterial Vaginitis (gardnerella): POSITIVE — AB
Candida Glabrata: NEGATIVE
Candida Vaginitis: NEGATIVE
Chlamydia: NEGATIVE
Comment: NEGATIVE
Comment: NEGATIVE
Comment: NEGATIVE
Comment: NEGATIVE
Comment: NEGATIVE
Comment: NORMAL
Neisseria Gonorrhea: NEGATIVE
Trichomonas: NEGATIVE

## 2022-11-21 LAB — HIV ANTIBODY (ROUTINE TESTING W REFLEX): HIV Screen 4th Generation wRfx: NONREACTIVE

## 2022-11-21 LAB — COMP. METABOLIC PANEL (12)
AST: 17 IU/L (ref 0–40)
Albumin/Globulin Ratio: 1.6 (ref 1.2–2.2)
Albumin: 4.3 g/dL (ref 3.8–4.9)
Alkaline Phosphatase: 112 IU/L (ref 44–121)
BUN/Creatinine Ratio: 20 (ref 9–23)
BUN: 17 mg/dL (ref 6–24)
Bilirubin Total: <0.2 mg/dL (ref 0.0–1.2)
Calcium: 9.1 mg/dL (ref 8.7–10.2)
Chloride: 104 mmol/L (ref 96–106)
Creatinine, Ser: 0.85 mg/dL (ref 0.57–1.00)
Globulin, Total: 2.7 g/dL (ref 1.5–4.5)
Glucose: 82 mg/dL (ref 70–99)
Potassium: 4.3 mmol/L (ref 3.5–5.2)
Sodium: 142 mmol/L (ref 134–144)
Total Protein: 7 g/dL (ref 6.0–8.5)
eGFR: 81 mL/min/{1.73_m2} (ref >59)

## 2022-11-21 LAB — TSH: TSH: 2.33 u[IU]/mL (ref 0.450–4.500)

## 2022-11-21 LAB — RPR: RPR Ser Ql: NONREACTIVE

## 2022-11-21 LAB — HCV AB W REFLEX TO QUANT PCR: HCV Ab: NONREACTIVE

## 2022-11-21 LAB — VITAMIN D 25 HYDROXY (VIT D DEFICIENCY, FRACTURES): Vit D, 25-Hydroxy: 15.7 ng/mL — ABNORMAL LOW (ref 30.0–100.0)

## 2022-11-21 LAB — HCV INTERPRETATION

## 2022-11-21 MED ORDER — VITAMIN D (ERGOCALCIFEROL) 1.25 MG (50000 UNIT) PO CAPS
50000.0000 [IU] | ORAL_CAPSULE | ORAL | 2 refills | Status: DC
Start: 2022-11-21 — End: 2023-01-11

## 2022-11-21 MED ORDER — METRONIDAZOLE 500 MG PO TABS
500.0000 mg | ORAL_TABLET | Freq: Two times a day (BID) | ORAL | 0 refills | Status: AC
Start: 2022-11-21 — End: 2022-11-28

## 2022-11-21 NOTE — Addendum Note (Signed)
Addended by: Roney Jaffe on: 11/21/2022 02:07 PM   Modules accepted: Orders

## 2022-11-22 LAB — URINE CULTURE

## 2022-12-03 ENCOUNTER — Emergency Department (HOSPITAL_BASED_OUTPATIENT_CLINIC_OR_DEPARTMENT_OTHER): Payer: Medicaid Other

## 2022-12-03 ENCOUNTER — Other Ambulatory Visit: Payer: Self-pay

## 2022-12-03 ENCOUNTER — Emergency Department (HOSPITAL_BASED_OUTPATIENT_CLINIC_OR_DEPARTMENT_OTHER)
Admission: EM | Admit: 2022-12-03 | Discharge: 2022-12-03 | Disposition: A | Discharge status: Home / Self Care | Payer: Medicaid Other | Attending: Emergency Medicine | Admitting: Emergency Medicine

## 2022-12-03 ENCOUNTER — Encounter (HOSPITAL_BASED_OUTPATIENT_CLINIC_OR_DEPARTMENT_OTHER): Payer: Self-pay

## 2022-12-03 DIAGNOSIS — J449 Chronic obstructive pulmonary disease, unspecified: Secondary | ICD-10-CM | POA: Diagnosis not present

## 2022-12-03 DIAGNOSIS — R1030 Lower abdominal pain, unspecified: Secondary | ICD-10-CM | POA: Diagnosis present

## 2022-12-03 DIAGNOSIS — N3090 Cystitis, unspecified without hematuria: Secondary | ICD-10-CM | POA: Insufficient documentation

## 2022-12-03 DIAGNOSIS — R1031 Right lower quadrant pain: Secondary | ICD-10-CM

## 2022-12-03 LAB — URINALYSIS, ROUTINE W REFLEX MICROSCOPIC
Bilirubin Urine: NEGATIVE
Glucose, UA: NEGATIVE mg/dL
Ketones, ur: NEGATIVE mg/dL
Nitrite: NEGATIVE
Protein, ur: NEGATIVE mg/dL
Specific Gravity, Urine: 1.015 (ref 1.005–1.030)
pH: 6 (ref 5.0–8.0)

## 2022-12-03 LAB — LIPASE, BLOOD: Lipase: 38 U/L (ref 11–51)

## 2022-12-03 LAB — CBC WITH DIFFERENTIAL/PLATELET
Abs Immature Granulocytes: 0.02 10*3/uL (ref 0.00–0.07)
Basophils Absolute: 0.1 10*3/uL (ref 0.0–0.1)
Basophils Relative: 1 %
Eosinophils Absolute: 0.5 10*3/uL (ref 0.0–0.5)
Eosinophils Relative: 6 %
HCT: 41.9 % (ref 36.0–46.0)
Hemoglobin: 14 g/dL (ref 12.0–15.0)
Immature Granulocytes: 0 %
Lymphocytes Relative: 42 %
Lymphs Abs: 3.7 10*3/uL (ref 0.7–4.0)
MCH: 32.3 pg (ref 26.0–34.0)
MCHC: 33.4 g/dL (ref 30.0–36.0)
MCV: 96.8 fL (ref 80.0–100.0)
Monocytes Absolute: 0.7 10*3/uL (ref 0.1–1.0)
Monocytes Relative: 8 %
Neutro Abs: 3.8 10*3/uL (ref 1.7–7.7)
Neutrophils Relative %: 43 %
Platelets: 350 10*3/uL (ref 150–400)
RBC: 4.33 MIL/uL (ref 3.87–5.11)
RDW: 13.2 % (ref 11.5–15.5)
WBC: 8.8 10*3/uL (ref 4.0–10.5)
nRBC: 0 % (ref 0.0–0.2)

## 2022-12-03 LAB — COMPREHENSIVE METABOLIC PANEL
ALT: 17 U/L (ref 0–44)
AST: 19 U/L (ref 15–41)
Albumin: 3.7 g/dL (ref 3.5–5.0)
Alkaline Phosphatase: 97 U/L (ref 38–126)
Anion gap: 10 (ref 5–15)
BUN: 15 mg/dL (ref 6–20)
CO2: 27 mmol/L (ref 22–32)
Calcium: 8.5 mg/dL — ABNORMAL LOW (ref 8.9–10.3)
Chloride: 100 mmol/L (ref 98–111)
Creatinine, Ser: 1.18 mg/dL — ABNORMAL HIGH (ref 0.44–1.00)
GFR, Estimated: 55 mL/min — ABNORMAL LOW (ref >60)
Glucose, Bld: 91 mg/dL (ref 70–99)
Potassium: 3.7 mmol/L (ref 3.5–5.1)
Sodium: 137 mmol/L (ref 135–145)
Total Bilirubin: 0.5 mg/dL (ref 0.3–1.2)
Total Protein: 7 g/dL (ref 6.5–8.1)

## 2022-12-03 LAB — URINALYSIS, MICROSCOPIC (REFLEX)
Bacteria, UA: NONE SEEN
RBC / HPF: NONE SEEN RBC/hpf (ref 0–5)

## 2022-12-03 MED ORDER — SODIUM CHLORIDE 0.9 % IV BOLUS
1000.0000 mL | Freq: Once | INTRAVENOUS | Status: AC
  Administered 2022-12-03: 1000 mL via INTRAVENOUS

## 2022-12-03 MED ORDER — IOHEXOL 300 MG/ML  SOLN
80.0000 mL | Freq: Once | INTRAMUSCULAR | Status: AC | PRN
  Administered 2022-12-03: 80 mL via INTRAVENOUS

## 2022-12-03 MED ORDER — CEPHALEXIN 250 MG PO CAPS
500.0000 mg | ORAL_CAPSULE | Freq: Once | ORAL | Status: AC
  Administered 2022-12-03: 500 mg via ORAL
  Filled 2022-12-03: qty 2

## 2022-12-03 MED ORDER — CEPHALEXIN 500 MG PO CAPS: 500.0000 mg | ORAL_CAPSULE | Freq: Two times a day (BID) | ORAL | 0 refills | Status: AC
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
## 2022-12-03 NOTE — Discharge Instructions (Signed)
You have been seen today for your complaint of abdominal pain. Your lab work showed a possible urinary tract infection but was otherwise reassuring. Your imaging showed some constipation but was otherwise reassuring. Your discharge medications include Keflex. This is an antibiotic. You should take it as prescribed. You should take it for the entire duration of the prescription. This may cause an upset stomach. This is normal. You may take this with food. You may also eat yogurt to prevent diarrhea.  MiraLAX.  This is an over-the-counter laxative.  Take 1 capful every 4 hours until you are able to have a normal bowel movement. Follow up with: Your primary care provider in 1 week for reevaluation Please seek immediate medical care if you develop any of the following symptoms: You cannot stop vomiting. Your pain is only in one part of the abdomen. Pain on the right side could be caused by appendicitis. You have bloody or black poop (stool), or poop that looks like tar. You have trouble breathing. You have chest pain. At this time there does not appear to be the presence of an emergent medical condition, however there is always the potential for conditions to change. Please read and follow the below instructions.  Do not take your medicine if  develop an itchy rash, swelling in your mouth or lips, or difficulty breathing; call 911 and seek immediate emergency medical attention if this occurs.  You may review your lab tests and imaging results in their entirety on your MyChart account.  Please discuss all results of fully with your primary care provider and other specialist at your follow-up visit.  Note: Portions of this text may have been transcribed using voice recognition software. Every effort was made to ensure accuracy; however, inadvertent computerized transcription errors may still be present.

## 2022-12-03 NOTE — ED Provider Notes (Signed)
[facility] Provider Note   CSN: [id number] Arrival date & time: 12/03/22  1912     History  Chief Complaint  Patient presents with   Abdominal Pain    Lauren Blackwell is a 57 y.o. female.  With history of COPD, IBS, polysubstance abuse, bipolar disorder, anxiety who presents to the ED for evaluation abdominal pain.  She states she was recently treated for urinary tract infection.  She finished her antibiotics approximately 5 days ago.  The day after she finished her antibiotics she developed worsening suprapubic abdominal pain.  States this radiates to the right lower quadrant to the right flank as well.  Describes it as a pinching sensation.  Currently rates the pain at a 6 out of 10.  She reports urinary frequency, urgency and dysuria as well.  She denies any fevers.  No nausea or vomiting.  States she does have some right-sided low back pain as well.  She denies any vaginal discharge.   Abdominal Pain      Home Medications Prior to Admission medications   Medication Sig Start Date End Date Taking? Authorizing Provider  cephALEXin (KEFLEX) 500 MG capsule Take 1 capsule (500 mg total) by mouth 2 (two) times daily for 5 days. 12/03/22 12/08/22 Yes Opha Mcghee, Edsel Petrin, PA-C  acetaminophen (TYLENOL) 500 MG tablet Take 1,000 mg by mouth as needed for moderate pain or headache.    [provider]  albuterol (VENTOLIN HFA) 108 (90 Base) MCG/ACT inhaler Inhale 1-2 puffs into the lungs every 6 (six) hours as needed for wheezing or shortness of breath. 11/20/22   Mayers, Cari S, PA-C  budesonide-formoterol (SYMBICORT) 160-4.5 MCG/ACT inhaler Inhale 2 puffs into the lungs 2 (two) times daily. 11/20/22   Mayers, Cari S, PA-C  DULoxetine (CYMBALTA) 30 MG capsule Take 30 mg by mouth daily.    [provider]  gabapentin (NEURONTIN) 300 MG capsule Take 300 mg by mouth 3 (three) times daily.    [provider]  nicotine (NICODERM CQ  - DOSED IN MG/24 HOURS) 21 mg/24hr patch Place 1 patch (21 mg total) onto the skin daily. 11/20/22   Mayers, Cari S, PA-C  Vitamin D, Ergocalciferol, (DRISDOL) 1.25 MG (50000 UNIT) CAPS capsule Take 1 capsule (50,000 Units total) by mouth every 7 (seven) days. 11/21/22   Mayers, Cari S, PA-C      Allergies    Patient has no known allergies.    Review of Systems   Review of Systems  Gastrointestinal:  Positive for abdominal pain.  All other systems reviewed and are negative.   Physical Exam Updated Vital Signs BP 100/67 (BP Location: Left Arm)   Pulse 83   Temp 98.2 F (36.8 C) (Oral)   Resp 18   Ht 4\' 11"  (1.499 m)   Wt 54.4 kg   SpO2 95%   BMI 24.24 kg/m  Physical Exam Vitals and nursing note reviewed.  Constitutional:      General: She is not in acute distress.    Appearance: She is well-developed. She is not ill-appearing, toxic-appearing or diaphoretic.     Comments: Resting comfortably in bed  HENT:     Head: Normocephalic and atraumatic.  Eyes:     Conjunctiva/sclera: Conjunctivae normal.  Cardiovascular:     Rate and Rhythm: Normal rate and regular rhythm.     Heart sounds: No murmur heard. Pulmonary:     Effort: Pulmonary effort is normal. No respiratory distress.  Breath sounds: Normal breath sounds.  Abdominal:     Palpations: Abdomen is soft.     Tenderness: There is abdominal tenderness. There is no right CVA tenderness, left CVA tenderness or guarding. Positive signs include McBurney's sign and obturator sign. Negative signs include psoas sign.  Musculoskeletal:        General: No swelling.     Cervical back: Neck supple.  Skin:    General: Skin is warm and dry.     Capillary Refill: Capillary refill takes less than 2 seconds.  Neurological:     Mental Status: She is alert.  Psychiatric:        Mood and Affect: Mood normal.     ED Results / Procedures / Treatments   Labs (all labs ordered are listed, but only abnormal results are displayed) Labs  Reviewed  COMPREHENSIVE METABOLIC PANEL - Abnormal; Notable for the following components:      Result Value   Creatinine, Ser 1.18 (*)    Calcium 8.5 (*)    GFR, Estimated 55 (*)    All other components within normal limits  URINALYSIS, ROUTINE W REFLEX MICROSCOPIC - Abnormal; Notable for the following components:   Hgb urine dipstick TRACE (*)    Leukocytes,Ua TRACE (*)    All other components within normal limits  CBC WITH DIFFERENTIAL/PLATELET  LIPASE, BLOOD  URINALYSIS, MICROSCOPIC (REFLEX)    EKG None  Radiology CT ABDOMEN PELVIS W CONTRAST  Result Date: 12/03/2022 CLINICAL DATA:  Right lower quadrant pain EXAM: CT ABDOMEN AND PELVIS WITH CONTRAST TECHNIQUE: Multidetector CT imaging of the abdomen and pelvis was performed using the standard protocol following bolus administration of intravenous contrast. RADIATION DOSE REDUCTION: This exam was performed according to the departmental dose-optimization program which includes automated exposure control, adjustment of the mA and/or kV according to patient size and/or use of iterative reconstruction technique. CONTRAST:  80mL OMNIPAQUE IOHEXOL 300 MG/ML  SOLN COMPARISON:  CT of the chest 09/27/2022 FINDINGS: Lower chest: Right lower lobe pulmonary nodules measuring up to 15 mm appear unchanged. Hepatobiliary: No focal liver abnormality is seen. No gallstones, gallbladder wall thickening, or biliary dilatation. Pancreas: Unremarkable. No pancreatic ductal dilatation or surrounding inflammatory changes. Spleen: Normal in size without focal abnormality. Adrenals/Urinary Tract: Adrenal glands are unremarkable. Kidneys are normal, without renal calculi, focal lesion, or hydronephrosis. Bladder is unremarkable. Stomach/Bowel: Stomach is within normal limits. Appendix appears normal. No evidence of bowel wall thickening, distention, or inflammatory changes. Vascular/Lymphatic: Aortic atherosclerosis. No enlarged abdominal or pelvic lymph nodes.  Reproductive: Uterus and bilateral adnexa are unremarkable. Other: No abdominal wall hernia or abnormality. No abdominopelvic ascites. Musculoskeletal: There is mild compression deformity of the superior endplate of L4 which appears chronic. IMPRESSION: 1. No CT evidence of acute abdominal/pelvic process. 2. Stable right lower lobe pulmonary nodules measuring up to 15 mm appear unchanged. 3. Mild compression deformity of the superior endplate of L4 appears chronic. Aortic Atherosclerosis (ICD10-I70.0). Electronically Signed   By: Darliss Cheney M.D.   On: 12/03/2022 21:19    Procedures Procedures    Medications Ordered in ED Medications  cephALEXin (KEFLEX) capsule 500 mg (has no administration in time range)  sodium chloride 0.9 % bolus 1,000 mL (1,000 mLs Intravenous New Bag/Given 12/03/22 2036)  iohexol (OMNIPAQUE) 300 MG/ML solution 80 mL (80 mLs Intravenous Contrast Given 12/03/22 2103)    ED Course/ Medical Decision Making/ A&P  Medical Decision Making Amount and/or Complexity of Data Reviewed Labs: ordered. Radiology: ordered.  Risk Prescription drug management.  This patient presents to the ED for concern of abdominal pain, this involves an extensive number of treatment options, and is a complaint that carries with it a high risk of complications and morbidity.  The differential diagnosis for generalized abdominal pain includes, but is not limited to AAA, gastroenteritis, appendicitis, Bowel obstruction, Bowel perforation. Gastroparesis, DKA, Hernia, Inflammatory bowel disease, mesenteric ischemia, pancreatitis, peritonitis SBP, volvulus.   Co morbidities that complicate the patient evaluation  COPD, IBS, polysubstance abuse, bipolar disorder, anxiety  My initial workup includes labs, imaging  Additional history obtained from: Nursing notes from this visit. Previous records within EMR system mobile visit on 11/20/2022 with diagnosis of cystitis,  bacterial vaginitis  I ordered, reviewed and interpreted labs which include: CBC, CMP, lipase, urinalysis.  Urinalysis with trace hemoglobin, trace leukocytes, 0 white blood cells, no bacteria  I ordered imaging studies including CT abdomen pelvis I independently visualized and interpreted imaging which showed no acute abdominal pelvic abnormalities I agree with the radiologist interpretation  Afebrile, hemodynamically stable.  57 year old female presenting to the ED for evaluation of suprapubic abdominal pain with radiation to the right flank.  she has mild abdominal TTP.  Labs overall reassuring.  Urine did have trace hemoglobin and trace leukocytes.  Given her urinary symptoms, will treat for cystitis with Keflex.  I also personally reviewed her CT scan.  There was a moderate amount of stool in the colon near where her pain is localized.  Wonder if constipation may be playing a role in her symptoms as well.  She was encouraged to take MiraLAX until she is able to have a bowel movement.  She was given strict return precautions.  Stable at discharge.  At this time there does not appear to be any evidence of an acute emergency medical condition and the patient appears stable for discharge with appropriate outpatient follow up. Diagnosis was discussed with patient who verbalizes understanding of care plan and is agreeable to discharge. I have discussed return precautions with patient who verbalizes understanding. Patient encouraged to follow-up with their PCP within 1 week. All questions answered.  Patient's case discussed with Dr. Lockie Mola who agrees with plan to discharge with follow-up.   Note: Portions of this report may have been transcribed using voice recognition software. Every effort was made to ensure accuracy; however, inadvertent computerized transcription errors may still be present.        Final Clinical Impression(s) / ED Diagnoses Final diagnoses:  Cystitis  Right lower  quadrant abdominal pain    Rx / DC Orders ED Discharge Orders          Ordered    cephALEXin (KEFLEX) 500 MG capsule  2 times daily        12/03/22 2143              Mora Bellman 12/03/22 2145    Virgina Norfolk, DO 12/03/22 2225

## 2022-12-11 ENCOUNTER — Ambulatory Visit: Payer: Medicaid Other | Admitting: Physician Assistant

## 2022-12-11 VITALS — BP 99/69 | HR 81 | Ht 59.0 in | Wt 119.2 lb

## 2022-12-11 DIAGNOSIS — F1410 Cocaine abuse, uncomplicated: Secondary | ICD-10-CM | POA: Diagnosis not present

## 2022-12-11 DIAGNOSIS — F411 Generalized anxiety disorder: Secondary | ICD-10-CM

## 2022-12-11 DIAGNOSIS — E559 Vitamin D deficiency, unspecified: Secondary | ICD-10-CM

## 2022-12-11 DIAGNOSIS — B3731 Acute candidiasis of vulva and vagina: Secondary | ICD-10-CM

## 2022-12-11 DIAGNOSIS — J029 Acute pharyngitis, unspecified: Secondary | ICD-10-CM

## 2022-12-11 DIAGNOSIS — F17210 Nicotine dependence, cigarettes, uncomplicated: Secondary | ICD-10-CM

## 2022-12-11 DIAGNOSIS — Z720 Tobacco use: Secondary | ICD-10-CM

## 2022-12-11 DIAGNOSIS — F1010 Alcohol abuse, uncomplicated: Secondary | ICD-10-CM

## 2022-12-11 DIAGNOSIS — J438 Other emphysema: Secondary | ICD-10-CM

## 2022-12-11 LAB — POCT RAPID STREP A (OFFICE): Rapid Strep A Screen: NEGATIVE

## 2022-12-11 MED ORDER — CETIRIZINE HCL 10 MG PO TABS
10.0000 mg | ORAL_TABLET | Freq: Every day | ORAL | 1 refills | Status: AC
Start: 2022-12-11 — End: ?

## 2022-12-11 MED ORDER — FLUCONAZOLE 150 MG PO TABS
150.0000 mg | ORAL_TABLET | Freq: Once | ORAL | 0 refills | Status: AC
Start: 2022-12-11 — End: 2022-12-11

## 2022-12-11 NOTE — Progress Notes (Signed)
Established Patient Office Visit  Subjective   Patient ID: Lauren Blackwell, female    DOB: Aug 07, 1965  Age: 57 y.o. MRN: [id number]  Chief Complaint  Patient presents with  . Urinary Frequency    C/o discomfort after urination    States that she continues to be treated for substance abuse at [facility] residential treatment center.  States that she plans on - leaves Wednesday in [place] - for sober living house - unsure still   States that she was seen in the emergency department on December 03, 2022.  Note from that visit:                         Medical Decision Making Amount and/or Complexity of Data Reviewed Labs: ordered. Radiology: ordered.   Risk Prescription drug management.   This patient presents to the ED for concern of abdominal pain, this involves an extensive number of treatment options, and is a complaint that carries with it a high risk of complications and morbidity.  The differential diagnosis for generalized abdominal pain includes, but is not limited to AAA, gastroenteritis, appendicitis, Bowel obstruction, Bowel perforation. Gastroparesis, DKA, Hernia, Inflammatory bowel disease, mesenteric ischemia, pancreatitis, peritonitis SBP, volvulus.    Co morbidities that complicate the patient evaluation   COPD, IBS, polysubstance abuse, bipolar disorder, anxiety   My initial workup includes labs, imaging   Additional history obtained from: 1. Nursing notes from this visit. 2. Previous records within EMR system mobile visit on 11/20/2022 with diagnosis of cystitis, bacterial vaginitis   I ordered, reviewed and interpreted labs which include: CBC, CMP, lipase, urinalysis.  Urinalysis with trace hemoglobin, trace leukocytes, 0 white blood cells, no bacteria   I ordered imaging studies including CT abdomen pelvis I independently visualized and interpreted imaging which showed no acute abdominal pelvic abnormalities I agree with the radiologist interpretation   Afebrile,  hemodynamically stable.  57 year old female presenting to the ED for evaluation of suprapubic abdominal pain with radiation to the right flank.  she has mild abdominal TTP.  Labs overall reassuring.  Urine did have trace hemoglobin and trace leukocytes.  Given her urinary symptoms, will treat for cystitis with Keflex.  I also personally reviewed her CT scan.  There was a moderate amount of stool in the colon near where her pain is localized.  Wonder if constipation may be playing a role in her symptoms as well.  She was encouraged to take MiraLAX until she is able to have a bowel movement.  She was given strict return precautions.  Stable at discharge.   At this time there does not appear to be any evidence of an acute emergency medical condition and the patient appears stable for discharge with appropriate outpatient follow up. Diagnosis was discussed with patient who verbalizes understanding of care plan and is agreeable to discharge. I have discussed return precautions with patient who verbalizes understanding. Patient encouraged to follow-up with their PCP within 1 week. All questions answered.      IMPRESSION: 1. No CT evidence of acute abdominal/pelvic process. 2. Stable right lower lobe pulmonary nodules measuring up to 15 mm appear unchanged. 3. Mild compression deformity of the superior endplate of L4 appears chronic.   Aortic Atherosclerosis (ICD10-I70.0).     Electronically Signed   By: Darliss Cheney M.D.   On: 12/03/2022 21:19 Sore throat for the past two days - unsure if it is due to pain with swallowing -  Feeling poorly - and  has diarrhea  Still has suprapubic pain but not as   Pressure before - going Peing more than normal    Past Medical History:  Diagnosis Date  . COPD (chronic obstructive pulmonary disease) (HCC)   . IBS (irritable bowel syndrome)   . IV drug abuse (HCC)    Heroin   Social History   Socioeconomic History  . Marital status: Single    Spouse  name: Not on file  . Number of children: Not on file  . Years of education: Not on file  . Highest education level: Not on file  Occupational History  . Not on file  Tobacco Use  . Smoking status: Every Day    Types: Cigarettes  . Smokeless tobacco: Never  Vaping Use  . Vaping Use: Never used  Substance and Sexual Activity  . Alcohol use: Not Currently  . Drug use: Not Currently    Types: IV, Methamphetamines    Comment: Heroin  . Sexual activity: Not on file  Other Topics Concern  . Not on file  Social History Narrative  . Not on file   Social Determinants of Health   Financial Resource Strain: Not on file  Food Insecurity: Not on file  Transportation Needs: Not on file  Physical Activity: Not on file  Stress: Not on file  Social Connections: Not on file  Intimate Partner Violence: Not on file   No family history on file. No Known Allergies  ROS    Objective:     BP 104/75 (BP Location: Left Arm, Patient Position: Sitting, Cuff Size: Normal)   Ht 4\' 11"  (1.499 m)   Wt 119 lb 3.2 oz (54.1 kg)   BMI 24.08 kg/m  BP Readings from Last 3 Encounters:  12/11/22 104/75  12/03/22 100/67  11/20/22 108/74   Wt Readings from Last 3 Encounters:  12/11/22 119 lb 3.2 oz (54.1 kg)  12/03/22 120 lb (54.4 kg)  11/20/22 114 lb (51.7 kg)      Physical Exam   No results found for any visits on 12/11/22.  {Labs (Optional):23779}  The 10-year ASCVD risk score (Arnett DK, et al., 2019) is: 2.6%    Assessment & Plan:   Problem List Items Addressed This Visit       Respiratory   Other emphysema (HCC)     Other   GAD (generalized anxiety disorder)   Tobacco abuse   Alcohol abuse   Cocaine abuse (HCC)   Other Visit Diagnoses     Acute cystitis without hematuria    -  Primary   Vitamin D deficiency           No follow-ups on file.    Kasandra Knudsen Mayers, PA-C

## 2022-12-11 NOTE — Patient Instructions (Addendum)
You are going to take Diflucan for your vaginal yeast infection.  For your sore throat, you are going to start taking Zyrtec on a daily basis.  Make sure that you are rinsing your mouth after using Symbicort.  Your strep test was negative  Please feel free to return to the mobile unit as needed.  Roney Jaffe, PA-C Physician Assistant [facility] [email or web address]   Pharyngitis  Pharyngitis is inflammation of the throat (pharynx). It is a very common cause of sore throat. Pharyngitis can be caused by a bacteria, but it is usually caused by a virus. Most cases of pharyngitis get better on their own without treatment. What are the causes? This condition may be caused by: Infection by viruses (viral). Viral pharyngitis spreads easily from person to person (is contagious) through coughing, sneezing, and sharing of personal items or utensils such as cups, forks, spoons, and toothbrushes. Infection by bacteria (bacterial). Bacterial pharyngitis may be spread by touching the nose or face after coming in contact with the bacteria, or through close contact, such as kissing. Allergies. Allergies can cause buildup of mucus in the throat (post-nasal drip), leading to inflammation and irritation. Allergies can also cause blocked nasal passages, forcing breathing through the mouth, which dries and irritates the throat. What increases the risk? You are more likely to develop this condition if: You are 57 years old. You are exposed to crowded environments such as daycare, school, or dormitory living. You live in a cold climate. You have a weakened disease-fighting (immune) system. What are the signs or symptoms? Symptoms of this condition vary by the cause. Common symptoms of this condition include: Sore throat. Fatigue. Low-grade fever. Stuffy nose (nasal congestion) and cough. Headache. Other symptoms may include: Glands in the neck  (lymph nodes) that are swollen. Skin rashes. Plaque-like film on the throat or tonsils. This is often a symptom of bacterial pharyngitis. Vomiting. Red, itchy eyes (conjunctivitis). Loss of appetite. Joint pain and muscle aches. Enlarged tonsils. How is this diagnosed? This condition may be diagnosed based on your medical history and a physical exam. Your health care provider will ask you questions about your illness and your symptoms. A swab of your throat may be done to check for bacteria (rapid strep test). Other lab tests may also be done, depending on the suspected cause, but these are rare. How is this treated? Many times, treatment is not needed for this condition. Pharyngitis usually gets better in 3-4 days without treatment. Bacterial pharyngitis may be treated with antibiotic medicines. Follow these instructions at home: Medicines Take over-the-counter and prescription medicines only as told by your health care provider. If you were prescribed an antibiotic medicine, take it as told by your health care provider. Do not stop taking the antibiotic even if you start to feel better. Use throat sprays to soothe your throat as told by your health care provider. Children can get pharyngitis. Do not give your child aspirin because of the association with Reye's syndrome. Managing pain To help with pain, try: Sipping warm liquids, such as broth, herbal tea, or warm water. Eating or drinking cold or frozen liquids, such as frozen ice pops. Gargling with a mixture of salt and water 3-4 times a day or as needed. To make salt water, completely dissolve -1 tsp (3-6 g) of salt in 1 cup (237 mL) of warm water. Sucking on hard candy or throat lozenges. Putting a cool-mist humidifier in your bedroom at night to moisten the air. Sitting  in the bathroom with the door closed for 5-10 minutes while you run hot water in the shower.  General instructions  Do not use any products that contain  nicotine or tobacco. These products include cigarettes, chewing tobacco, and vaping devices, such as e-cigarettes. If you need help quitting, ask your health care provider. Rest as told by your health care provider. Drink enough fluid to keep your urine pale yellow. How is this prevented? To help prevent becoming infected or spreading infection: Wash your hands often with soap and water for at least 20 seconds. If soap and water are not available, use hand sanitizer. Do not touch your eyes, nose, or mouth with unwashed hands, and wash hands after touching these areas. Do not share cups or eating utensils. Avoid close contact with people who are sick. Contact a health care provider if: You have large, tender lumps in your neck. You have a rash. You cough up green, yellow-brown, or bloody mucus. Get help right away if: Your neck becomes stiff. You drool or are unable to swallow liquids. You cannot drink or take medicines without vomiting. You have severe pain that does not go away, even after you take medicine. You have trouble breathing, and it is not caused by a stuffy nose. You have new pain and swelling in your joints such as the knees, ankles, wrists, or elbows. These symptoms may represent a serious problem that is an emergency. Do not wait to see if the symptoms will go away. Get medical help right away. Call your local emergency services (911 in the [place] Do not drive yourself to the hospital. Summary Pharyngitis is redness, pain, and swelling (inflammation) of the throat (pharynx). While pharyngitis can be caused by a bacteria, the most common causes are viral. Most cases of pharyngitis get better on their own without treatment. Bacterial pharyngitis is treated with antibiotic medicines. This information is not intended to replace advice given to you by your health care provider. Make sure you discuss any questions you have with your health care provider. Document Revised:  09/01/2020 Document Reviewed: 09/01/2020 Elsevier Patient Education  2024 [employer].

## 2022-12-12 ENCOUNTER — Encounter: Payer: Self-pay | Admitting: Physician Assistant

## 2022-12-26 IMAGING — DX DG CHEST 1V PORT
1 series · 1 of 1 positions shown · non-contrast
Comparison: Portable exam 5174 hours without priors for comparison

CLINICAL DATA: [place] gas station following
witnessed IV heroin use, history COPD, smoker

EXAM:
PORTABLE CHEST 1 VIEW

[chest]
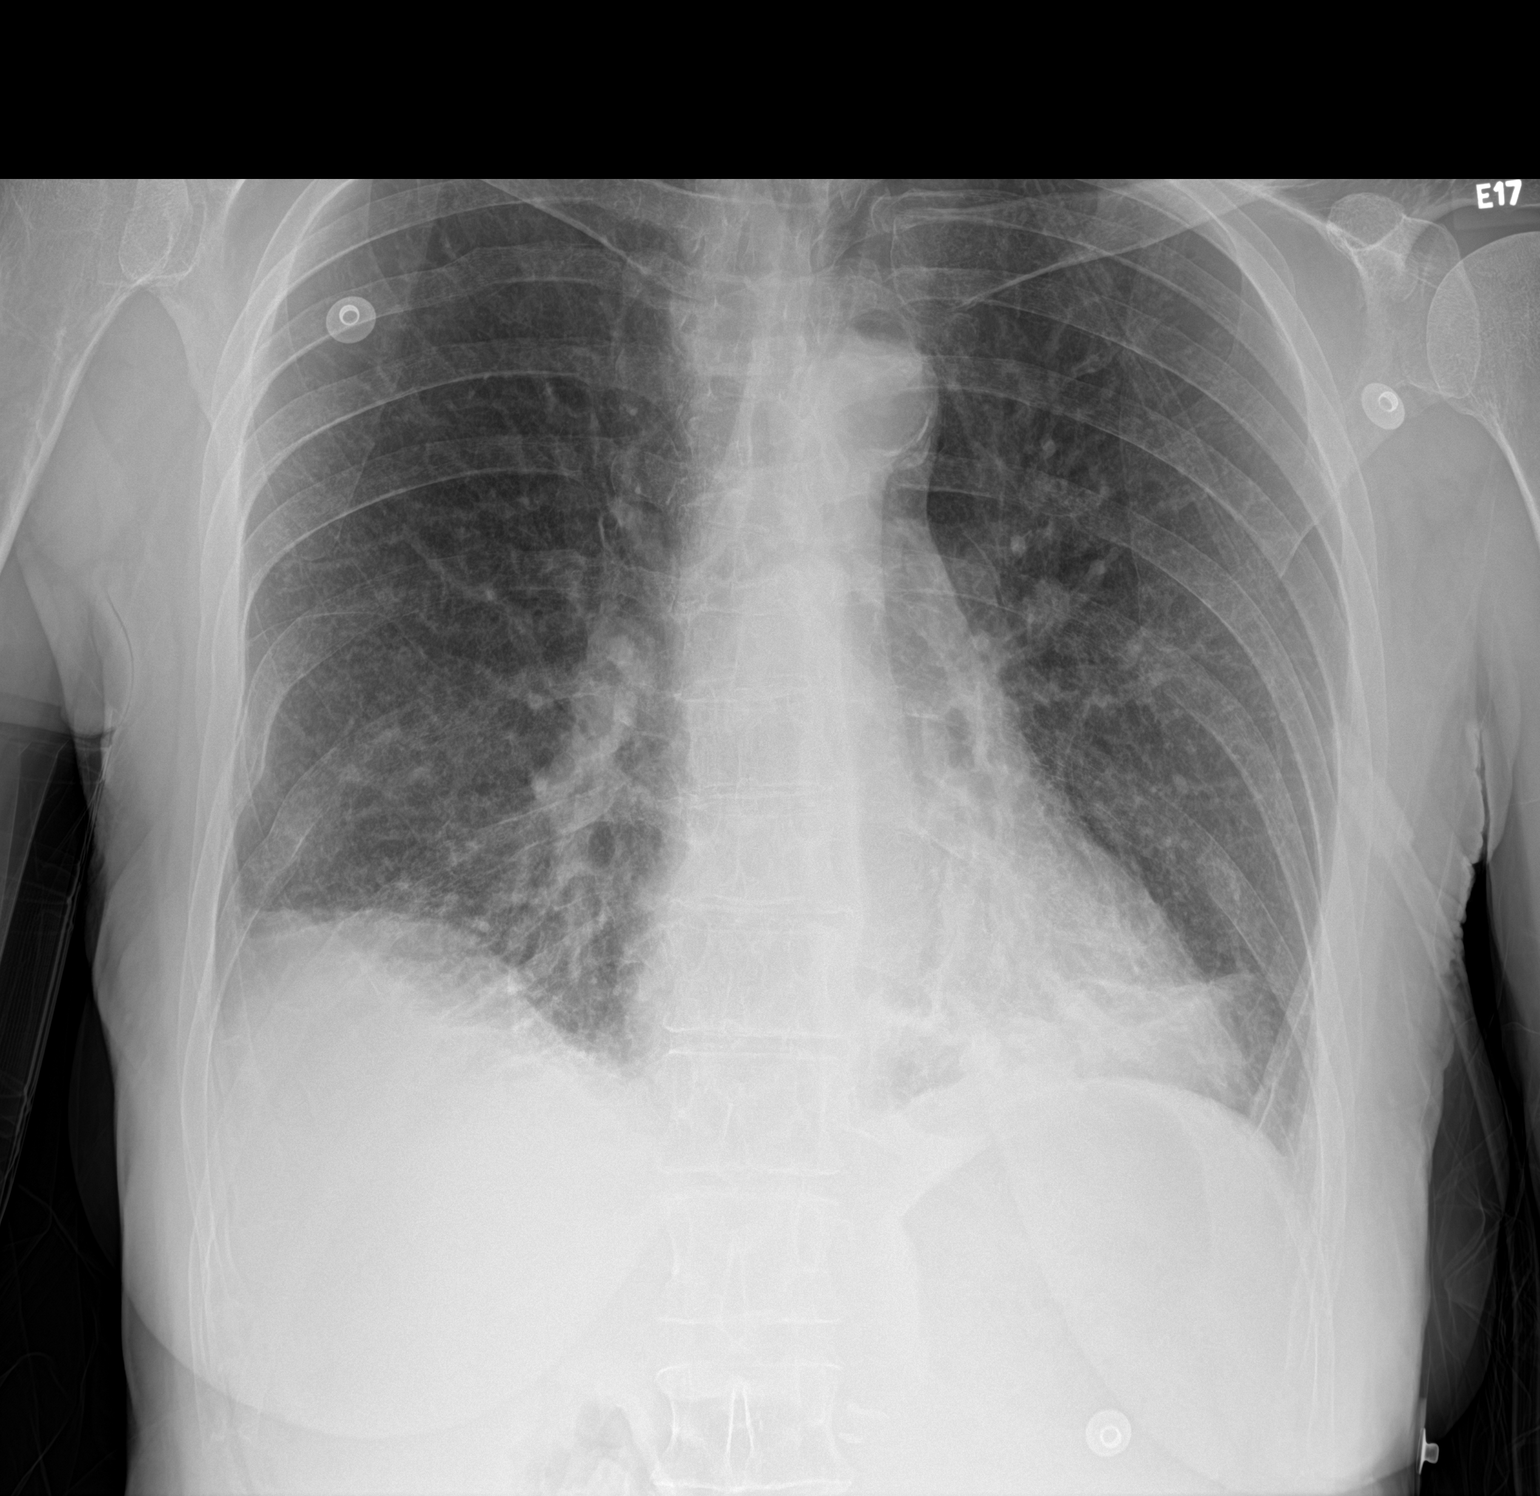

[1 of 1 positions shown; findings below may reference images not displayed]

FINDINGS: Normal heart size, mediastinal contours, and pulmonary vascularity.

Atherosclerotic calcification aorta.

Bibasilar atelectasis.

No definite infiltrate, pleural effusion, or pneumothorax.

Multiple old RIGHT rib fractures without acute osseous findings.
IMPRESSION: Bibasilar atelectasis.

Aortic Atherosclerosis ([id number].W).

## 2023-01-09 ENCOUNTER — Encounter (HOSPITAL_COMMUNITY): Payer: Self-pay

## 2023-01-09 ENCOUNTER — Other Ambulatory Visit (HOSPITAL_COMMUNITY)
Admission: EM | Admit: 2023-01-09 | Discharge: 2023-01-12 | Disposition: A | Discharge status: Home / Self Care | Payer: MEDICAID | Source: Home / Self Care | Admitting: Psychiatry

## 2023-01-09 ENCOUNTER — Emergency Department (HOSPITAL_COMMUNITY)
Admission: EM | Admit: 2023-01-09 | Discharge: 2023-01-09 | Disposition: A | Discharge status: Other Acute Inpatient Hospital | Payer: MEDICAID | Attending: Emergency Medicine | Admitting: Emergency Medicine

## 2023-01-09 DIAGNOSIS — F17213 Nicotine dependence, cigarettes, with withdrawal: Secondary | ICD-10-CM | POA: Insufficient documentation

## 2023-01-09 DIAGNOSIS — F1210 Cannabis abuse, uncomplicated: Secondary | ICD-10-CM | POA: Insufficient documentation

## 2023-01-09 DIAGNOSIS — F10129 Alcohol abuse with intoxication, unspecified: Secondary | ICD-10-CM | POA: Diagnosis not present

## 2023-01-09 DIAGNOSIS — F32A Depression, unspecified: Secondary | ICD-10-CM

## 2023-01-09 DIAGNOSIS — F17210 Nicotine dependence, cigarettes, uncomplicated: Secondary | ICD-10-CM | POA: Insufficient documentation

## 2023-01-09 DIAGNOSIS — F4310 Post-traumatic stress disorder, unspecified: Secondary | ICD-10-CM | POA: Insufficient documentation

## 2023-01-09 DIAGNOSIS — R45851 Suicidal ideations: Secondary | ICD-10-CM | POA: Diagnosis not present

## 2023-01-09 DIAGNOSIS — J449 Chronic obstructive pulmonary disease, unspecified: Secondary | ICD-10-CM | POA: Insufficient documentation

## 2023-01-09 DIAGNOSIS — R7981 Abnormal blood-gas level: Secondary | ICD-10-CM | POA: Diagnosis not present

## 2023-01-09 DIAGNOSIS — Y902 Blood alcohol level of 40-59 mg/100 ml: Secondary | ICD-10-CM | POA: Diagnosis not present

## 2023-01-09 DIAGNOSIS — E8809 Other disorders of plasma-protein metabolism, not elsewhere classified: Secondary | ICD-10-CM | POA: Diagnosis not present

## 2023-01-09 DIAGNOSIS — F1914 Other psychoactive substance abuse with psychoactive substance-induced mood disorder: Secondary | ICD-10-CM | POA: Insufficient documentation

## 2023-01-09 DIAGNOSIS — F1094 Alcohol use, unspecified with alcohol-induced mood disorder: Secondary | ICD-10-CM | POA: Insufficient documentation

## 2023-01-09 DIAGNOSIS — Z5900 Homelessness unspecified: Secondary | ICD-10-CM | POA: Insufficient documentation

## 2023-01-09 DIAGNOSIS — F1014 Alcohol abuse with alcohol-induced mood disorder: Secondary | ICD-10-CM | POA: Diagnosis present

## 2023-01-09 DIAGNOSIS — F1510 Other stimulant abuse, uncomplicated: Secondary | ICD-10-CM | POA: Diagnosis not present

## 2023-01-09 DIAGNOSIS — F1090 Alcohol use, unspecified, uncomplicated: Secondary | ICD-10-CM

## 2023-01-09 DIAGNOSIS — Z720 Tobacco use: Secondary | ICD-10-CM

## 2023-01-09 DIAGNOSIS — F1010 Alcohol abuse, uncomplicated: Secondary | ICD-10-CM | POA: Diagnosis present

## 2023-01-09 DIAGNOSIS — F1590 Other stimulant use, unspecified, uncomplicated: Secondary | ICD-10-CM

## 2023-01-09 LAB — COMPREHENSIVE METABOLIC PANEL
ALT: 16 U/L (ref 0–44)
AST: 21 U/L (ref 15–41)
Albumin: 3.4 g/dL — ABNORMAL LOW (ref 3.5–5.0)
Alkaline Phosphatase: 98 U/L (ref 38–126)
Anion gap: 13 (ref 5–15)
BUN: 6 mg/dL (ref 6–20)
CO2: 19 mmol/L — ABNORMAL LOW (ref 22–32)
Calcium: 8.2 mg/dL — ABNORMAL LOW (ref 8.9–10.3)
Chloride: 107 mmol/L (ref 98–111)
Creatinine, Ser: 0.57 mg/dL (ref 0.44–1.00)
GFR, Estimated: >60 mL/min (ref >60)
Glucose, Bld: 77 mg/dL (ref 70–99)
Potassium: 3.8 mmol/L (ref 3.5–5.1)
Sodium: 139 mmol/L (ref 135–145)
Total Bilirubin: 0.3 mg/dL (ref 0.3–1.2)
Total Protein: 6.4 g/dL — ABNORMAL LOW (ref 6.5–8.1)

## 2023-01-09 LAB — CBC WITH DIFFERENTIAL/PLATELET
Abs Immature Granulocytes: 0.01 10*3/uL (ref 0.00–0.07)
Basophils Absolute: 0.1 10*3/uL (ref 0.0–0.1)
Basophils Relative: 1 %
Eosinophils Absolute: 0.2 10*3/uL (ref 0.0–0.5)
Eosinophils Relative: 4 %
HCT: 44.5 % (ref 36.0–46.0)
Hemoglobin: 14.9 g/dL (ref 12.0–15.0)
Immature Granulocytes: 0 %
Lymphocytes Relative: 39 %
Lymphs Abs: 2.1 10*3/uL (ref 0.7–4.0)
MCH: 33.1 pg (ref 26.0–34.0)
MCHC: 33.5 g/dL (ref 30.0–36.0)
MCV: 98.9 fL (ref 80.0–100.0)
Monocytes Absolute: 0.6 10*3/uL (ref 0.1–1.0)
Monocytes Relative: 10 %
Neutro Abs: 2.5 10*3/uL (ref 1.7–7.7)
Neutrophils Relative %: 46 %
Platelets: 354 10*3/uL (ref 150–400)
RBC: 4.5 MIL/uL (ref 3.87–5.11)
RDW: 14.5 % (ref 11.5–15.5)
WBC: 5.3 10*3/uL (ref 4.0–10.5)
nRBC: 0 % (ref 0.0–0.2)

## 2023-01-09 LAB — ETHANOL: Alcohol, Ethyl (B): 45 mg/dL — ABNORMAL HIGH (ref <10)

## 2023-01-09 LAB — RAPID URINE DRUG SCREEN, HOSP PERFORMED
Amphetamines: POSITIVE — AB
Barbiturates: NOT DETECTED
Benzodiazepines: POSITIVE — AB
Cocaine: POSITIVE — AB
Opiates: NOT DETECTED
Tetrahydrocannabinol: POSITIVE — AB

## 2023-01-09 MED ORDER — CHLORDIAZEPOXIDE HCL 25 MG PO CAPS: 25.0000 mg | ORAL_CAPSULE | ORAL | Status: DC

## 2023-01-09 MED ORDER — ACETAMINOPHEN 325 MG PO TABS
650.0000 mg | ORAL_TABLET | Freq: Four times a day (QID) | ORAL | Status: DC | PRN
  Administered 2023-01-10 – 2023-01-12 (×3): 650 mg via ORAL
  Filled 2023-01-09 (×3): qty 2

## 2023-01-09 MED ORDER — ONDANSETRON 4 MG PO TBDP: 4.0000 mg | ORAL_TABLET | Freq: Four times a day (QID) | ORAL | Status: DC | PRN

## 2023-01-09 MED ORDER — ADULT MULTIVITAMIN W/MINERALS CH: 1.0000 | ORAL_TABLET | Freq: Every day | ORAL | Status: DC

## 2023-01-09 MED ORDER — ADULT MULTIVITAMIN W/MINERALS CH
1.0000 | ORAL_TABLET | Freq: Every day | ORAL | Status: DC
  Administered 2023-01-10 – 2023-01-12 (×3): 1 via ORAL
  Filled 2023-01-09 (×3): qty 1

## 2023-01-09 MED ORDER — LORATADINE 10 MG PO TABS
10.0000 mg | ORAL_TABLET | Freq: Every day | ORAL | Status: DC | PRN
  Filled 2023-01-09: qty 10

## 2023-01-09 MED ORDER — GABAPENTIN 300 MG PO CAPS
300.0000 mg | ORAL_CAPSULE | Freq: Three times a day (TID) | ORAL | Status: DC
  Administered 2023-01-09: 300 mg via ORAL
  Filled 2023-01-09: qty 1

## 2023-01-09 MED ORDER — IBUPROFEN 400 MG PO TABS: 600.0000 mg | ORAL_TABLET | Freq: Three times a day (TID) | ORAL | Status: DC | PRN

## 2023-01-09 MED ORDER — LOPERAMIDE HCL 2 MG PO CAPS: 2.0000 mg | ORAL_CAPSULE | ORAL | Status: DC | PRN

## 2023-01-09 MED ORDER — ONDANSETRON HCL 4 MG PO TABS: 4.0000 mg | ORAL_TABLET | Freq: Three times a day (TID) | ORAL | Status: DC | PRN

## 2023-01-09 MED ORDER — NICOTINE 21 MG/24HR TD PT24
21.0000 mg | MEDICATED_PATCH | Freq: Every day | TRANSDERMAL | Status: DC
  Administered 2023-01-10 – 2023-01-12 (×3): 21 mg via TRANSDERMAL
  Filled 2023-01-09 (×3): qty 1
  Filled 2023-01-09: qty 14

## 2023-01-09 MED ORDER — MAGNESIUM HYDROXIDE 400 MG/5ML PO SUSP: 30.0000 mL | Freq: Every day | ORAL | Status: DC | PRN

## 2023-01-09 MED ORDER — CHLORDIAZEPOXIDE HCL 25 MG PO CAPS
25.0000 mg | ORAL_CAPSULE | Freq: Four times a day (QID) | ORAL | Status: AC
  Administered 2023-01-09 – 2023-01-10 (×4): 25 mg via ORAL
  Filled 2023-01-09 (×4): qty 1

## 2023-01-09 MED ORDER — DULOXETINE HCL 30 MG PO CPEP
30.0000 mg | ORAL_CAPSULE | Freq: Every day | ORAL | Status: DC
  Administered 2023-01-09: 30 mg via ORAL
  Filled 2023-01-09: qty 1

## 2023-01-09 MED ORDER — LORAZEPAM 1 MG PO TABS: 1.0000 mg | ORAL_TABLET | Freq: Four times a day (QID) | ORAL | Status: DC | PRN

## 2023-01-09 MED ORDER — GABAPENTIN 300 MG PO CAPS
300.0000 mg | ORAL_CAPSULE | Freq: Three times a day (TID) | ORAL | Status: DC
  Administered 2023-01-09 – 2023-01-12 (×8): 300 mg via ORAL
  Filled 2023-01-09: qty 42
  Filled 2023-01-09 (×8): qty 1

## 2023-01-09 MED ORDER — ALUM & MAG HYDROXIDE-SIMETH 200-200-20 MG/5ML PO SUSP: 30.0000 mL | ORAL | Status: DC | PRN

## 2023-01-09 MED ORDER — CHLORDIAZEPOXIDE HCL 25 MG PO CAPS: 25.0000 mg | ORAL_CAPSULE | Freq: Four times a day (QID) | ORAL | Status: DC | PRN

## 2023-01-09 MED ORDER — HYDROXYZINE HCL 25 MG PO TABS
25.0000 mg | ORAL_TABLET | Freq: Four times a day (QID) | ORAL | Status: DC | PRN
  Administered 2023-01-09: 25 mg via ORAL
  Filled 2023-01-09: qty 1
  Filled 2023-01-09: qty 15

## 2023-01-09 MED ORDER — CHLORDIAZEPOXIDE HCL 25 MG PO CAPS: 25.0000 mg | ORAL_CAPSULE | Freq: Every day | ORAL | Status: DC

## 2023-01-09 MED ORDER — DULOXETINE HCL 30 MG PO CPEP
30.0000 mg | ORAL_CAPSULE | Freq: Every day | ORAL | Status: DC
  Administered 2023-01-10 – 2023-01-12 (×3): 30 mg via ORAL
  Filled 2023-01-09 (×2): qty 1
  Filled 2023-01-09: qty 14
  Filled 2023-01-09: qty 1

## 2023-01-09 MED ORDER — THIAMINE HCL 100 MG/ML IJ SOLN
100.0000 mg | Freq: Once | INTRAMUSCULAR | Status: AC
  Administered 2023-01-09: 100 mg via INTRAMUSCULAR
  Filled 2023-01-09: qty 2

## 2023-01-09 MED ORDER — THIAMINE MONONITRATE 100 MG PO TABS
100.0000 mg | ORAL_TABLET | Freq: Every day | ORAL | Status: DC
  Administered 2023-01-10 – 2023-01-12 (×3): 100 mg via ORAL
  Filled 2023-01-09: qty 1
  Filled 2023-01-09: qty 14
  Filled 2023-01-09 (×2): qty 1

## 2023-01-09 MED ORDER — THIAMINE HCL 100 MG/ML IJ SOLN: 100.0000 mg | Freq: Once | INTRAMUSCULAR | Status: DC

## 2023-01-09 MED ORDER — CHLORDIAZEPOXIDE HCL 25 MG PO CAPS
25.0000 mg | ORAL_CAPSULE | Freq: Three times a day (TID) | ORAL | Status: DC
  Administered 2023-01-10 – 2023-01-11 (×2): 25 mg via ORAL
  Filled 2023-01-09 (×2): qty 1

## 2023-01-09 NOTE — ED Notes (Addendum)
Patient was admitted to the [facility] unit from [facility]. Patient denies SI/HI and AVH. Patient reports she can visualize her son's face that was murdered on her birthday. Patient reports she is looking for residential treatment. Patient completed her admission paperwork and received her medications. Patient complained of itching and was given 25 mg of Vistaril. Patient was oriented to the unit and will be monitored for safety.
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
## 2023-01-09 NOTE — ED Notes (Signed)
Belonging placed in locker 3
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
## 2023-01-09 NOTE — ED Notes (Signed)
Patient observed/assessed in room in bed appearing in no immediate distress resting peacefully. Q15 minute checks continued by MHT and nursing staff. Will continue to monitor and support. 

## 2023-01-09 NOTE — ED Provider Notes (Signed)
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 medication management is called for.  We will collect UA in the setting of recent cystitis as well as morning lipids.  On later discussion, patient noted full body itching as if being bitten by bugs.  On inspection, no marks were seen.  Plan:   #Alcohol use disorders, severe, in withdrawal #Stimulant use disorder, methamphetamine and cocaine types, in withdrawal #Substance-induced mood disorder -Begin CIWA protocol. - Begin 4-day Librium 25 mg taper (7/23 - 7/27).  Begin at 4 times a day, with 1 fewer scheduled dose each following day. - Begin Librium 25 mg as needed every 6 hours for CIWA scores greater than 10. -  Begin duloxetine 30 mg daily to target mood and neuropathic pain. Begin gabapentin 300 mg 3 times daily for anxiety and mood stabilization. - Begin hydroxyzine 25 mg as needed every 6 hours for anxiety/agitation/pruritus. - Begin loperamide 2 to 4 mg  q. RN for diarrhea/loose stools. - Begin magnesium hydroxide 30 mL daily as needed for mild constipation. - Begin Zofran 4 mg every 6 hours as needed for nausea and vomiting. - Give vitamin B1 injection 100 mg once. - Begin thiamine supplementation 100 mg p.o. daily. - Begin multivitamin daily. - Consider naltrexone versus acamprosate therapy to curb alcohol cravings.  #Nicotine use disorder - Begin NicoDerm patch 21 mg.  #Posttraumatic stress disorder, multifactorial - Discuss plan for psychiatric follow-up after discharge from long-term substance use treatment. - Consider prazosin for nightmares across stay.  #Pruritus -Given as needed hydroxyzine 25 mg, will recheck tomorrow if no improvement noted.  #Dispo - Will send out patient information for long term outpatient substance use treatment.  Likely LOS 3-5 days.  Recommendations  Based on my evaluation the patient does not appear to have an emergency medical condition.  Tomie China, MD 01/09/23  3:52 PM

## 2023-01-09 NOTE — ED Notes (Signed)
SAFE transportation at ED entrance to pick pt up for transport to [facility]. Report called. All documents and pt belongings handed to transporter. Pt verbalized understanding of plan at this time. Pt ambulated out of ED w steady gait, a&ox4,VSS,NAD. EMTALA completed and attached with documents prior to departure.

## 2023-01-09 NOTE — Progress Notes (Signed)
Pt's CIWA was 5. °

## 2023-01-09 NOTE — ED Provider Notes (Signed)
Emergency Medicine Observation Re-evaluation Note  Lauren Blackwell is a 57 y.o. female, seen on rounds today.  Pt initially presented to the ED for complaints of Alcohol Intoxication Currently, the patient is accepted to [facility].  Physical Exam  BP 126/88   Pulse 94   Temp 98.4 F (36.9 C)   Resp 18   SpO2 97%  Physical Exam General: alert Cardiac: regular Lungs: CTA Psych: calm  ED Course / MDM  EKG:   I have reviewed the labs performed to date as well as medications administered while in observation.  Recent changes in the last 24 hours include none.  Plan  Current plan is for psych treatment at [facility].    Arby Barrette, MD 01/09/23 [phone]

## 2023-01-09 NOTE — ED Triage Notes (Signed)
Pt with etoh on board per EMS, found in the woods, patient is aaox3, patient also states she is tired of living like this and sometimes wants to end her life, she is homeless and lives at a shelter but doesn't like it there because people keep stealing her stuff.

## 2023-01-09 NOTE — ED Notes (Signed)
Report called to Eminence Sink, RN at [facility]

## 2023-01-09 NOTE — ED Notes (Signed)
Patient observed/assessed in patient room. Patient alert and oriented x 4. Affect is flat. Patient denies pain and anxiety. He denies A/V/H. He denies having any thoughts/plan of self harm and harm towards others. Fluid and snack offered. Patient states that appetite has been good throughout the day.  Verbalizes no further complaints at this time. Will continue to monitor and support.

## 2023-01-10 ENCOUNTER — Encounter (HOSPITAL_COMMUNITY): Payer: Self-pay

## 2023-01-10 LAB — URINALYSIS, ROUTINE W REFLEX MICROSCOPIC
Bacteria, UA: NONE SEEN
Bilirubin Urine: NEGATIVE
Glucose, UA: NEGATIVE mg/dL
Ketones, ur: NEGATIVE mg/dL
Leukocytes,Ua: NEGATIVE
Nitrite: NEGATIVE
Protein, ur: NEGATIVE mg/dL
Specific Gravity, Urine: 1.012 (ref 1.005–1.030)
pH: 5 (ref 5.0–8.0)

## 2023-01-10 LAB — HEPATITIS PANEL, ACUTE
HCV Ab: NONREACTIVE
Hep A IgM: NONREACTIVE
Hep B C IgM: NONREACTIVE
Hepatitis B Surface Ag: NONREACTIVE

## 2023-01-10 LAB — LIPID PANEL
Cholesterol: 185 mg/dL (ref 0–200)
HDL: 68 mg/dL (ref >40)
LDL Cholesterol: 102 mg/dL — ABNORMAL HIGH (ref 0–99)
Total CHOL/HDL Ratio: 2.7 RATIO
Triglycerides: 74 mg/dL (ref <150)
VLDL: 15 mg/dL (ref 0–40)

## 2023-01-10 LAB — HIV ANTIBODY (ROUTINE TESTING W REFLEX): HIV Screen 4th Generation wRfx: NONREACTIVE

## 2023-01-10 MED ORDER — NALTREXONE HCL 50 MG PO TABS
50.0000 mg | ORAL_TABLET | Freq: Every day | ORAL | Status: DC
  Administered 2023-01-10 – 2023-01-12 (×3): 50 mg via ORAL
  Filled 2023-01-10 (×2): qty 1
  Filled 2023-01-10: qty 14
  Filled 2023-01-10: qty 1

## 2023-01-10 NOTE — ED Notes (Signed)
Patient observed/assessed in room in bed appearing in no immediate distress resting peacefully. Q15 minute checks continued by MHT and nursing staff. Will continue to monitor and support. 

## 2023-01-10 NOTE — ED Notes (Signed)
Pt sleeping@this  time.breathing even ad unlabored.will continue to monitor for safety

## 2023-01-10 NOTE — Progress Notes (Signed)
Pt's CIWA was 2. °

## 2023-01-10 NOTE — Group Note (Signed)
Group Topic: Wellness  Group Date: 01/09/2023 Start Time: 1900 End Time: 1930 Facilitators: Emmit Pomfret D, NT  Department: [facility]  Number of Participants: 1  Group Focus: acceptance Treatment Modality:  Psychoeducation Interventions utilized were assignment Purpose: increase insight  Name: Lauren Blackwell Date of Birth: 1966-02-23  MR: [id number]    Level of Participation: withdrawn Quality of Participation: withdrawn Interactions with others:   Mood/Affect:   Triggers (if applicable):   Cognition:   Progress: Other Response:   Plan: follow-up needed  Patients Problems:  Patient Active Problem List   Diagnosis Date Noted   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/2023   Alcohol-induced mood disorder (HCC) 01/09/2023   Alcohol use disorder 01/09/2023   Other emphysema (HCC) 11/21/2022   Aortic atherosclerosis (HCC) 11/21/2022   Right lower lobe pulmonary nodule 11/21/2022   GAD (generalized anxiety disorder) 11/21/2022   Bipolar disorder, in full remission, most recent episode mixed (HCC) 11/21/2022   Tobacco abuse 11/21/2022   Alcohol abuse 11/21/2022   Cocaine abuse (HCC) 11/21/2022   Methamphetamine abuse (HCC) 11/21/2022   Acute hypoxemic respiratory failure (HCC) 07/08/2021   Methamphetamine use disorder, severe, dependence (HCC) 07/08/2021   Opioid overdose (HCC) 07/08/2021   Respiratory failure (HCC) 07/08/2021

## 2023-01-10 NOTE — ED Notes (Signed)
Patient was informed of breakfast time and did not attend

## 2023-01-10 NOTE — Progress Notes (Signed)
Pt is presently asleep. Respirations are even and unlabored. Pt complained of back pain. No signs of acute distress noted. PRN Tylenol and scheduled meds administered with no issue. Pt denies current SI/HI/AVH, plan or intent. Staff will monitor for pt's safety.

## 2023-01-10 NOTE — Group Note (Signed)
Group Topic: Positive Affirmations  Group Date: 01/10/2023 Start Time: 1015 End Time: 1045 Facilitators: Rayvon Char, Donata Clay; Derycke, Maryjane Hurter, NT  Department: [facility]  Number of Participants: 9  Group Focus: affirmation Treatment Modality:  Psychoeducation Interventions utilized were patient education Purpose: increase insight  Name: Breeanne Oblinger Date of Birth: Oct 28, 1965  MR: [id number]    Level of Participation: Patient did not attend group. Patients Problems:  Patient Active Problem List   Diagnosis Date Noted   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/2023   Alcohol-induced mood disorder (HCC) 01/09/2023   Alcohol use disorder 01/09/2023   Other emphysema (HCC) 11/21/2022   Aortic atherosclerosis (HCC) 11/21/2022   Right lower lobe pulmonary nodule 11/21/2022   GAD (generalized anxiety disorder) 11/21/2022   Bipolar disorder, in full remission, most recent episode mixed (HCC) 11/21/2022   Tobacco abuse 11/21/2022   Alcohol abuse 11/21/2022   Cocaine abuse (HCC) 11/21/2022   Methamphetamine abuse (HCC) 11/21/2022   Acute hypoxemic respiratory failure (HCC) 07/08/2021   Methamphetamine use disorder, severe, dependence (HCC) 07/08/2021   Opioid overdose (HCC) 07/08/2021   Respiratory failure (HCC) 07/08/2021

## 2023-01-10 NOTE — Group Note (Signed)
Group Topic: Understanding Self  Group Date: 01/10/2023 Start Time: 1610 End Time: 1645 Facilitators: Oleh Genin, RN  Department: [facility]  Number of Participants: 10  Group Focus: acceptance, coping skills, daily focus, discharge education, and problem solving Treatment Modality:  Psychoeducation Interventions utilized were mental fitness, patient education, and problem solving Purpose: enhance coping skills, express feelings, and increase insight  Name: Lauren Blackwell Date of Birth: 11/17/1965  MR: [id number]    Level of Participation: active Quality of Participation: attentive and cooperative Interactions with others: gave feedback Mood/Affect: appropriate Triggers (if applicable): N/A Cognition: goal directed Progress: Significant Response: Patient was participative.  Plan: follow-up needed  Patients Problems:  Patient Active Problem List   Diagnosis Date Noted   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/2023   Alcohol-induced mood disorder (HCC) 01/09/2023   Alcohol use disorder 01/09/2023   Other emphysema (HCC) 11/21/2022   Aortic atherosclerosis (HCC) 11/21/2022   Right lower lobe pulmonary nodule 11/21/2022   GAD (generalized anxiety disorder) 11/21/2022   Bipolar disorder, in full remission, most recent episode mixed (HCC) 11/21/2022   Tobacco abuse 11/21/2022   Alcohol abuse 11/21/2022   Cocaine abuse (HCC) 11/21/2022   Methamphetamine abuse (HCC) 11/21/2022   Acute hypoxemic respiratory failure (HCC) 07/08/2021   Methamphetamine use disorder, severe, dependence (HCC) 07/08/2021   Opioid overdose (HCC) 07/08/2021   Respiratory failure (HCC) 07/08/2021

## 2023-01-10 NOTE — Tx Team (Signed)
LCSW, MD, and Resident met with patient to assess current mood, affect, physical state, and inquire about needs/goals while here in [facility] and after discharge. Patient reports she presented due to needing to quit drinking. Patient reports she has been drinking "for a long time" and reports she really needs help at this time. Patient reports she drinks at least four 42ounce beers per day. Patient reports she occasionally uses meth and crack cocaine, however reports "that's only once in a while". Patient reports she has been homeless for the last three years and reports she has been homeless mainly in [address] and [place], [place]. Patient reports she is not able to go to any homeless shelter in [place] due to "my drinking". Patient reports she does not receive any income, denies having access to transport, and reports no legal issues or upcoming court dates. Patient reports she has been in and out of treatment numerous of times, and reports she was just discharged from [facility] in [place] 30 days ago. Patient was not able to recall any other agency she has been to. Patient denies any outpatient therapy services or medication management. Patient reports her current goal is to get into a residential program for further treatment and to secure housing for herself. Tailored Medicaid plan listed for the patient. Options may be limited. Patient aware that LCSW will follow up with updates regarding available residential programs. Patient aware that if placement is not obtainable then team will follow up regarding housing placement at the [facility] in [place]. Patient expressed understanding. No other needs were reported at this time by patient. LCSW will send referrals out for review. Once update has been received, LCSW will inform.   Referrals to be sent to [facility], application for [facility] to be completed by patient if interested, and [facility]. Patient will not meet criteria for [facility] due to tailored Medicaid.    Lauren Boyden, LCSW Clinical Social Worker [facility] Ph: [phone]

## 2023-01-10 NOTE — ED Notes (Signed)
Pt sleeping@this  time pt declined AA meeting no pain or distress noted will be noted.

## 2023-01-10 NOTE — ED Notes (Signed)
Patient was provided dinner

## 2023-01-10 NOTE — BH IP Treatment Plan (Signed)
Interdisciplinary Treatment and Diagnostic Plan Update  01/10/2023 Time of Session: 9:50am Lauren Blackwell MRN: [id number]  Diagnosis:  Final diagnoses:  Alcohol-induced mood disorder with depressive symptoms (HCC)  Alcohol use disorder  Stimulant use disorder  Cannabis abuse  Cigarette nicotine dependence with withdrawal     Current Medications:  Current Facility-Administered Medications  Medication Dose Route Frequency Provider Last Rate Last Admin   acetaminophen (TYLENOL) tablet 650 mg  650 mg Oral Q6H PRN Ardis Hughs, NP   650 mg at 01/10/23 0901   alum & mag hydroxide-simeth (MAALOX/MYLANTA) 200-200-20 MG/5ML suspension 30 mL  30 mL Oral Q4H PRN Ardis Hughs, NP       chlordiazePOXIDE (LIBRIUM) capsule 25 mg  25 mg Oral Q6H PRN Tomie China, MD       chlordiazePOXIDE (LIBRIUM) capsule 25 mg  25 mg Oral QID Tomie China, MD   25 mg at 01/10/23 4696   Followed by   chlordiazePOXIDE (LIBRIUM) capsule 25 mg  25 mg Oral TID Tomie China, MD       Followed by   Melene Muller ON 01/11/2023] chlordiazePOXIDE (LIBRIUM) capsule 25 mg  25 mg Oral Reginal Lutes, MD       Followed by   Melene Muller ON 01/12/2023] chlordiazePOXIDE (LIBRIUM) capsule 25 mg  25 mg Oral Daily Tomie China, MD       DULoxetine (CYMBALTA) DR capsule 30 mg  30 mg Oral Daily Ardis Hughs, NP   30 mg at 01/10/23 0857   gabapentin (NEURONTIN) capsule 300 mg  300 mg Oral TID Ardis Hughs, NP   300 mg at 01/10/23 2952   hydrOXYzine (ATARAX) tablet 25 mg  25 mg Oral Q6H PRN Ardis Hughs, NP   25 mg at 01/09/23 1549   loperamide (IMODIUM) capsule 2-4 mg  2-4 mg Oral PRN Ardis Hughs, NP       loratadine (CLARITIN) tablet 10 mg  10 mg Oral Daily PRN Ardis Hughs, NP       magnesium hydroxide (MILK OF MAGNESIA) suspension 30 mL  30 mL Oral Daily PRN Ardis Hughs, NP       multivitamin with minerals tablet 1 tablet  1 tablet Oral Daily Ardis Hughs,  NP   1 tablet at 01/10/23 0857   naltrexone (DEPADE) tablet 50 mg  50 mg Oral Daily Tomie China, MD   50 mg at 01/10/23 0901   nicotine (NICODERM CQ - dosed in mg/24 hours) patch 21 mg  21 mg Transdermal Daily Vernard Gambles H, NP   21 mg at 01/10/23 0858   ondansetron (ZOFRAN-ODT) disintegrating tablet 4 mg  4 mg Oral Q6H PRN Ardis Hughs, NP       thiamine (VITAMIN B1) tablet 100 mg  100 mg Oral Daily Ardis Hughs, NP   100 mg at 01/10/23 8413   Current Outpatient Medications  Medication Sig Dispense Refill   albuterol (VENTOLIN HFA) 108 (90 Base) MCG/ACT inhaler Inhale 1-2 puffs into the lungs every 6 (six) hours as needed for wheezing or shortness of breath. 6.7 g 1   budesonide-formoterol (SYMBICORT) 160-4.5 MCG/ACT inhaler Inhale 2 puffs into the lungs 2 (two) times daily. 1 each 1   cetirizine (ZYRTEC ALLERGY) 10 MG tablet Take 1 tablet (10 mg total) by mouth daily. 30 tablet 1   DULoxetine (CYMBALTA) 30 MG capsule Take 30 mg by mouth daily.     gabapentin (NEURONTIN) 300 MG capsule Take 300 mg by mouth 3 (  three) times daily.     nicotine (NICODERM CQ - DOSED IN MG/24 HOURS) 21 mg/24hr patch Place 1 patch (21 mg total) onto the skin daily. 28 patch 0   Vitamin D, Ergocalciferol, (DRISDOL) 1.25 MG (50000 UNIT) CAPS capsule Take 1 capsule (50,000 Units total) by mouth every 7 (seven) days. (Patient taking differently: Take 50,000 Units by mouth every Thursday.) 4 capsule 2   PTA Medications: Prior to Admission medications   Medication Sig Start Date End Date Taking? Authorizing Provider  albuterol (VENTOLIN HFA) 108 (90 Base) MCG/ACT inhaler Inhale 1-2 puffs into the lungs every 6 (six) hours as needed for wheezing or shortness of breath. 11/20/22  Yes Mayers, Cari S, PA-C  budesonide-formoterol (SYMBICORT) 160-4.5 MCG/ACT inhaler Inhale 2 puffs into the lungs 2 (two) times daily. 11/20/22  Yes Mayers, Cari S, PA-C  cetirizine (ZYRTEC ALLERGY) 10 MG tablet Take 1 tablet (10  mg total) by mouth daily. 12/11/22  Yes Mayers, Cari S, PA-C  DULoxetine (CYMBALTA) 30 MG capsule Take 30 mg by mouth daily.   Yes [provider]  gabapentin (NEURONTIN) 300 MG capsule Take 300 mg by mouth 3 (three) times daily.   Yes [provider]  nicotine (NICODERM CQ - DOSED IN MG/24 HOURS) 21 mg/24hr patch Place 1 patch (21 mg total) onto the skin daily. 11/20/22  Yes Mayers, Cari S, PA-C  Vitamin D, Ergocalciferol, (DRISDOL) 1.25 MG (50000 UNIT) CAPS capsule Take 1 capsule (50,000 Units total) by mouth every 7 (seven) days. Patient taking differently: Take 50,000 Units by mouth every Thursday. 11/21/22  Yes Mayers, Kasandra Knudsen, PA-C    Patient Stressors: Financial difficulties   Substance abuse   Other: homelessness    Patient Strengths: Average or above average intelligence  Capable of independent living   Treatment Modalities: Medication Management, Group therapy, Case management,  1 to 1 session with clinician, Psychoeducation, Recreational therapy.   Physician Treatment Plan for Primary and Secondary Diagnosis:  Final diagnoses:  Alcohol-induced mood disorder with depressive symptoms (HCC)  Alcohol use disorder  Stimulant use disorder  Cannabis abuse  Cigarette nicotine dependence with withdrawal   Long Term Goal(s): Improvement in symptoms so as ready for discharge  Short Term Goals: Patient will verbalize feelings in meetings with treatment team members. Patient will attend at least of 50% of the groups daily. Pt will complete the PHQ9 on admission, day 3 and discharge. Patient will participate in completing the [place] Suicide Severity Rating Scale Patient will score a low risk of violence for 24 hours prior to discharge Patient will take medications as prescribed daily.  Medication Management: Evaluate patient's response, side effects, and tolerance of medication regimen.  Therapeutic Interventions: 1 to 1 sessions, Unit Group sessions and Medication  administration.  Evaluation of Outcomes: Progressing  LCSW Treatment Plan for Primary Diagnosis:  Final diagnoses:  Alcohol-induced mood disorder with depressive symptoms (HCC)  Alcohol use disorder  Stimulant use disorder  Cannabis abuse  Cigarette nicotine dependence with withdrawal    Long Term Goal(s): Safe transition to appropriate next level of care at discharge.  Short Term Goals: Facilitate acceptance of mental health diagnosis and concerns through verbal commitment to aftercare plan and appointments at discharge., Patient will identify one social support prior to discharge to aid in patient's recovery., Patient will attend AA/NA groups as scheduled., Identify minimum of 2 triggers associated with mental health/substance abuse issues with treatment team members., and Increase skills for wellness and recovery by attending 50% of scheduled groups.  Therapeutic Interventions: Assess for all discharge needs, 1 to 1 time with Child psychotherapist, Explore available resources and support systems, Assess for adequacy in community support network, Educate family and significant other(s) on suicide prevention, Complete Psychosocial Assessment, Interpersonal group therapy.  Evaluation of Outcomes: Progressing   Progress in Treatment: Attending groups: No. Participating in groups: No. Taking medication as prescribed: Yes. Toleration medication: Yes. Family/Significant other contact made: No, will contact:  Patient declined collateral at this time. LCSW will follow up at a later time.  Patient understands diagnosis: Yes. Discussing patient identified problems/goals with staff: Yes. Medical problems stabilized or resolved: Yes. Denies suicidal/homicidal ideation: Yes. Issues/concerns per patient self-inventory: Yes. Other: homelessness and need for treatment  New problem(s) identified: No, Describe:  other than reported on admission.   New Short Term/Long Term Goal(s): Safe transition to  appropriate next level of care at discharge, Engage patient in therapeutic group addressing interpersonal concerns. Engage patient in aftercare planning with referrals and resources, Increase ability to appropriately verbalize feelings, Facilitate acceptance of mental health diagnosis and concerns and Identify triggers associated with mental health/substance abuse issues.   Patient Goals:  Patient is seeking residential placement at this time for substance use with hopes to secure placement within 3-5 days.   Discharge Plan or Barriers: LCSW will send referrals out for review for residential placement. Updates will be provided as received.   Reason for Continuation of Hospitalization: Medication stabilization Withdrawal symptoms  Estimated Length of Stay: 3-5 days  Last 3 [place] Suicide Severity Risk Score: Flowsheet Row ED from 01/09/2023 in [facility] Most recent reading at 01/09/2023  5:00 PM ED from 01/09/2023 in [facility] Emergency Department at [facility] Most recent reading at 01/09/2023  6:00 AM ED from 12/03/2022 in [facility] Emergency Department at [facility] Most recent reading at 12/03/2022  7:31 PM  C-SSRS RISK CATEGORY No Risk Moderate Risk No Risk       Last PHQ 2/9 Scores:    01/09/2023    6:31 PM  Depression screen PHQ 2/9  Decreased Interest 3  Down, Depressed, Hopeless 3  PHQ - 2 Score 6  Altered sleeping 3  Tired, decreased energy 3  Change in appetite 3  Feeling bad or failure about yourself  3  Trouble concentrating 3  Moving slowly or fidgety/restless 0  Suicidal thoughts 1  PHQ-9 Score 22    Scribe for Treatment Team: Lenny Pastel 01/10/2023 9:55 AM

## 2023-01-10 NOTE — Group Note (Signed)
Group Topic: Understanding Self  Group Date: 01/10/2023 Start Time: 1610 End Time: 1650 Facilitators: Oleh Genin, RN  Department: [facility]  Number of Participants: 10  Group Focus: acceptance, communication, and coping skills Treatment Modality:  Psychoeducation Interventions utilized were exploration, mental fitness, patient education, and problem solving Purpose: enhance coping skills, express feelings, and improve communication skills  Name: Chandrika Sandles Date of Birth: Jul 30, 1965  MR: [id number]    Level of Participation: active Quality of Participation: cooperative and motivated Interactions with others: gave feedback Mood/Affect: appropriate and brightens with interaction Triggers (if applicable): N/A Cognition: goal directed, insightful, and logical Progress: Significant Response: Patient reported the group was beneficial.  Plan: follow-up needed  Patients Problems:  Patient Active Problem List   Diagnosis Date Noted   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/2023   Alcohol-induced mood disorder (HCC) 01/09/2023   Alcohol use disorder 01/09/2023   Other emphysema (HCC) 11/21/2022   Aortic atherosclerosis (HCC) 11/21/2022   Right lower lobe pulmonary nodule 11/21/2022   GAD (generalized anxiety disorder) 11/21/2022   Bipolar disorder, in full remission, most recent episode mixed (HCC) 11/21/2022   Tobacco abuse 11/21/2022   Alcohol abuse 11/21/2022   Cocaine abuse (HCC) 11/21/2022   Methamphetamine abuse (HCC) 11/21/2022   Acute hypoxemic respiratory failure (HCC) 07/08/2021   Methamphetamine use disorder, severe, dependence (HCC) 07/08/2021   Opioid overdose (HCC) 07/08/2021   Respiratory failure (HCC) 07/08/2021

## 2023-01-10 NOTE — Progress Notes (Signed)
Pt's CIWA was 1. 

## 2023-01-10 NOTE — Group Note (Signed)
Group Topic: Communication  Group Date: 01/10/2023 Start Time: 1936 End Time: 2000 Facilitators: Rae Lips B  Department: [facility]  Number of Participants: 10  Group Focus: activities of daily living skills Treatment Modality:  Leisure Development Interventions utilized were story telling Purpose: enhance coping skills, express feelings, increase insight, regain self-worth, reinforce self-care, and relapse prevention strategies  Name: Irine Heminger Date of Birth: [birth year]  MR: [id number]    Level of Participation: Did not attend group. Quality of Participation: NA Interactions with others: NA Mood/Affect: NA Triggers (if applicable): NA Cognition: NA Progress: Other Response: NA Plan: patient will be encouraged to go to groups.   Patients Problems:  Patient Active Problem List   Diagnosis Date Noted   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/2023   Alcohol-induced mood disorder (HCC) 01/09/2023   Alcohol use disorder 01/09/2023   Other emphysema (HCC) 11/21/2022   Aortic atherosclerosis (HCC) 11/21/2022   Right lower lobe pulmonary nodule 11/21/2022   GAD (generalized anxiety disorder) 11/21/2022   Bipolar disorder, in full remission, most recent episode mixed (HCC) 11/21/2022   Tobacco abuse 11/21/2022   Alcohol abuse 11/21/2022   Cocaine abuse (HCC) 11/21/2022   Methamphetamine abuse (HCC) 11/21/2022   Acute hypoxemic respiratory failure (HCC) 07/08/2021   Methamphetamine use disorder, severe, dependence (HCC) 07/08/2021   Opioid overdose (HCC) 07/08/2021   Respiratory failure (HCC) 07/08/2021

## 2023-01-10 NOTE — ED Provider Notes (Signed)
Behavioral Health Progress Note  Date and Time: 01/10/2023 8:54 AM Name: Lauren Blackwell MRN:  [id number]  Subjective: Vitals within normal limits.  No acute events overnight.  Awaiting labs.  UA unremarkable.  Lipids within normal limits.  PRNs, hydroxyzine 25 mg x 1.  No refusals.  Day 1 of 4 Librium taper.  CIWAs: 5, 0.  On interview this morning inpatient room with door open, patient is "still sleepy."  Feels generally better than she did before.  Reports itching resolved on administration of hydroxyzine 25 mg as needed.  Open to starting naltrexone.  Denies SI, HI, and AVH at present, however she has visual hallucinations at baseline and has not been awake long enough to see them yet.  Mood is "better than yesterday."  But she said that it was difficult to talk about mood.  Discussed plan to hear back from inpatient substance use treatment centers today.  Patient amenable.  No new concerns.  Diagnosis:  Final diagnoses:  Alcohol-induced mood disorder with depressive symptoms (HCC)  Alcohol use disorder  Stimulant use disorder  Cannabis abuse  Cigarette nicotine dependence with withdrawal    Total Time spent with patient: 15 minutes  Past Psychiatric History: Patient has a very complex PTSD history which includes hypervigilance secondary to major trauma.  Has primary psychiatric diagnoses, however it is difficult to pin down the validity of these as she has never had a significant period in her life where she has not used psychedelic drugs.  Patient endorses having been put on gabapentin in the past for mood stabilization and anxiety.  Past Medical History: Per chart review, patient was recently treated for complicated cystitis secondary to UTI, prescribed Keflex.  She also has a long history of numerous fractures secondary to falls. Family History: Multiple members completed suicide.  Patient describes a history of CSA. Social History: Homeless since February 2021.  Has been seen in multiple  shelters.  No current social support.  Has Medicaid.  Undergoing disability claim currently.  Additional Social History:                         Sleep: Fair  Appetite:  Good  Current Medications:  Current Facility-Administered Medications  Medication Dose Route Frequency Provider Last Rate Last Admin   acetaminophen (TYLENOL) tablet 650 mg  650 mg Oral Q6H PRN Ardis Hughs, NP       alum & mag hydroxide-simeth (MAALOX/MYLANTA) 200-200-20 MG/5ML suspension 30 mL  30 mL Oral Q4H PRN Ardis Hughs, NP       chlordiazePOXIDE (LIBRIUM) capsule 25 mg  25 mg Oral Q6H PRN Tomie China, MD       chlordiazePOXIDE (LIBRIUM) capsule 25 mg  25 mg Oral QID Tomie China, MD   25 mg at 01/09/23 2144   Followed by   chlordiazePOXIDE (LIBRIUM) capsule 25 mg  25 mg Oral TID Tomie China, MD       Followed by   Melene Muller ON 01/11/2023] chlordiazePOXIDE (LIBRIUM) capsule 25 mg  25 mg Oral Reginal Lutes, MD       Followed by   Melene Muller ON 01/12/2023] chlordiazePOXIDE (LIBRIUM) capsule 25 mg  25 mg Oral Daily Tomie China, MD       DULoxetine (CYMBALTA) DR capsule 30 mg  30 mg Oral Daily Ardis Hughs, NP       gabapentin (NEURONTIN) capsule 300 mg  300 mg Oral TID Ardis Hughs, NP   300 mg at  01/09/23 2144   hydrOXYzine (ATARAX) tablet 25 mg  25 mg Oral Q6H PRN Ardis Hughs, NP   25 mg at 01/09/23 1549   loperamide (IMODIUM) capsule 2-4 mg  2-4 mg Oral PRN Ardis Hughs, NP       loratadine (CLARITIN) tablet 10 mg  10 mg Oral Daily PRN Ardis Hughs, NP       magnesium hydroxide (MILK OF MAGNESIA) suspension 30 mL  30 mL Oral Daily PRN Ardis Hughs, NP       multivitamin with minerals tablet 1 tablet  1 tablet Oral Daily Ardis Hughs, NP       naltrexone (DEPADE) tablet 50 mg  50 mg Oral Daily Tomie China, MD       nicotine (NICODERM CQ - dosed in mg/24 hours) patch 21 mg  21 mg Transdermal Daily Ardis Hughs, NP       ondansetron (ZOFRAN-ODT) disintegrating tablet 4 mg  4 mg Oral Q6H PRN Ardis Hughs, NP       thiamine (VITAMIN B1) tablet 100 mg  100 mg Oral Daily Ardis Hughs, NP       Current Outpatient Medications  Medication Sig Dispense Refill   albuterol (VENTOLIN HFA) 108 (90 Base) MCG/ACT inhaler Inhale 1-2 puffs into the lungs every 6 (six) hours as needed for wheezing or shortness of breath. 6.7 g 1   budesonide-formoterol (SYMBICORT) 160-4.5 MCG/ACT inhaler Inhale 2 puffs into the lungs 2 (two) times daily. 1 each 1   cetirizine (ZYRTEC ALLERGY) 10 MG tablet Take 1 tablet (10 mg total) by mouth daily. 30 tablet 1   DULoxetine (CYMBALTA) 30 MG capsule Take 30 mg by mouth daily.     gabapentin (NEURONTIN) 300 MG capsule Take 300 mg by mouth 3 (three) times daily.     nicotine (NICODERM CQ - DOSED IN MG/24 HOURS) 21 mg/24hr patch Place 1 patch (21 mg total) onto the skin daily. 28 patch 0   Vitamin D, Ergocalciferol, (DRISDOL) 1.25 MG (50000 UNIT) CAPS capsule Take 1 capsule (50,000 Units total) by mouth every 7 (seven) days. (Patient taking differently: Take 50,000 Units by mouth every Thursday.) 4 capsule 2    Labs  Lab Results:  Admission on 01/09/2023  Component Date Value Ref Range Status   Color, Urine 01/10/2023 YELLOW  YELLOW Final   APPearance 01/10/2023 CLEAR  CLEAR Final   Specific Gravity, Urine 01/10/2023 1.012  1.005 - 1.030 Final   pH 01/10/2023 5.0  5.0 - 8.0 Final   Glucose, UA 01/10/2023 NEGATIVE  NEGATIVE mg/dL Final   Hgb urine dipstick 01/10/2023 SMALL (A)  NEGATIVE Final   Bilirubin Urine 01/10/2023 NEGATIVE  NEGATIVE Final   Ketones, ur 01/10/2023 NEGATIVE  NEGATIVE mg/dL Final   Protein, ur 64/40/3474 NEGATIVE  NEGATIVE mg/dL Final   Nitrite 25/95/6387 NEGATIVE  NEGATIVE Final   Leukocytes,Ua 01/10/2023 NEGATIVE  NEGATIVE Final   RBC / HPF 01/10/2023 0-5  0 - 5 RBC/hpf Final   WBC, UA 01/10/2023 0-5  0 - 5 WBC/hpf Final   Bacteria, UA  01/10/2023 NONE SEEN  NONE SEEN Final   Squamous Epithelial / HPF 01/10/2023 0-5  0 - 5 /HPF Final   Mucus 01/10/2023 PRESENT   Final   Performed at [facility], [address], [place], [place] [address]   Cholesterol 01/10/2023 185  0 - 200 mg/dL Final   Triglycerides 29/51/8841 74  <150 mg/dL Final   HDL 66/11/3014 68  >  40 mg/dL Final   Total CHOL/HDL Ratio 01/10/2023 2.7  RATIO Final   VLDL 01/10/2023 15  0 - 40 mg/dL Final   LDL Cholesterol 01/10/2023 102 (H)  0 - 99 mg/dL Final   Comment:        Total Cholesterol/HDL:CHD Risk Coronary Heart Disease Risk Table                     Men   Women  1/2 Average Risk   3.4   3.3  Average Risk       5.0   4.4  2 X Average Risk   9.6   7.1  3 X Average Risk  23.4   11.0        Use the calculated Patient Ratio above and the CHD Risk Table to determine the patient's CHD Risk.        ATP III CLASSIFICATION (LDL):  <100     mg/dL   Optimal  098-119  mg/dL   Near or Above                    Optimal  130-159  mg/dL   Borderline  147-829  mg/dL   High  >562     mg/dL   Very High Performed at [facility], [address], [place], [place] [address]   Admission on 01/09/2023, Discharged on 01/09/2023  Component Date Value Ref Range Status   Sodium 01/09/2023 139  135 - 145 mmol/L Final   Potassium 01/09/2023 3.8  3.5 - 5.1 mmol/L Final   Chloride 01/09/2023 107  98 - 111 mmol/L Final   CO2 01/09/2023 19 (L)  22 - 32 mmol/L Final   Glucose, Bld 01/09/2023 77  70 - 99 mg/dL Final   Glucose reference range applies only to samples taken after fasting for at least 8 hours.   BUN 01/09/2023 6  6 - 20 mg/dL Final   Creatinine, Ser 01/09/2023 0.57  0.44 - 1.00 mg/dL Final   Calcium 57/84/6962 8.2 (L)  8.9 - 10.3 mg/dL Final   Total Protein 95/28/4132 6.4 (L)  6.5 - 8.1 g/dL Final   Albumin 44/06/270 3.4 (L)  3.5 - 5.0 g/dL Final   AST 53/66/4403 21  15 - 41 U/L Final   ALT 01/09/2023 16  0 - 44 U/L Final   Alkaline Phosphatase  01/09/2023 98  38 - 126 U/L Final   Total Bilirubin 01/09/2023 0.3  0.3 - 1.2 mg/dL Final   GFR, Estimated 01/09/2023 >60  >60 mL/min Final   Comment: (NOTE) Calculated using the CKD-EPI Creatinine Equation (2021)    Anion gap 01/09/2023 13  5 - 15 Final   Performed at [facility], [address], [place], [place] [address]   Alcohol, Ethyl (B) 01/09/2023 45 (H)  <10 mg/dL Final   Comment: (NOTE) Lowest detectable limit for serum alcohol is 10 mg/dL.  For medical purposes only. Performed at [facility], [address], [place], [place] [address]    Opiates 01/09/2023 NONE DETECTED  NONE DETECTED Final   Cocaine 01/09/2023 POSITIVE (A)  NONE DETECTED Final   Benzodiazepines 01/09/2023 POSITIVE (A)  NONE DETECTED Final   Amphetamines 01/09/2023 POSITIVE (A)  NONE DETECTED Final   Tetrahydrocannabinol 01/09/2023 POSITIVE (A)  NONE DETECTED Final   Barbiturates 01/09/2023 NONE DETECTED  NONE DETECTED Final   Comment: (NOTE) DRUG SCREEN FOR MEDICAL PURPOSES ONLY.  IF CONFIRMATION IS NEEDED FOR ANY  PURPOSE, NOTIFY LAB WITHIN 5 DAYS.  LOWEST DETECTABLE LIMITS FOR URINE DRUG SCREEN Drug Class                     Cutoff (ng/mL) Amphetamine and metabolites    1000 Barbiturate and metabolites    200 Benzodiazepine                 200 Opiates and metabolites        300 Cocaine and metabolites        300 THC                            50 Performed at [facility], [address], [place], [place] [address]    WBC 01/09/2023 5.3  4.0 - 10.5 K/uL Final   RBC 01/09/2023 4.50  3.87 - 5.11 MIL/uL Final   Hemoglobin 01/09/2023 14.9  12.0 - 15.0 g/dL Final   HCT 13/01/6577 44.5  36.0 - 46.0 % Final   MCV 01/09/2023 98.9  80.0 - 100.0 fL Final   MCH 01/09/2023 33.1  26.0 - 34.0 pg Final   MCHC 01/09/2023 33.5  30.0 - 36.0 g/dL Final   RDW 46/96/2952 14.5  11.5 - 15.5 % Final   Platelets 01/09/2023 354  150 - 400 K/uL Final   nRBC 01/09/2023 0.0  0.0 - 0.2 % Final    Neutrophils Relative % 01/09/2023 46  % Final   Neutro Abs 01/09/2023 2.5  1.7 - 7.7 K/uL Final   Lymphocytes Relative 01/09/2023 39  % Final   Lymphs Abs 01/09/2023 2.1  0.7 - 4.0 K/uL Final   Monocytes Relative 01/09/2023 10  % Final   Monocytes Absolute 01/09/2023 0.6  0.1 - 1.0 K/uL Final   Eosinophils Relative 01/09/2023 4  % Final   Eosinophils Absolute 01/09/2023 0.2  0.0 - 0.5 K/uL Final   Basophils Relative 01/09/2023 1  % Final   Basophils Absolute 01/09/2023 0.1  0.0 - 0.1 K/uL Final   Immature Granulocytes 01/09/2023 0  % Final   Abs Immature Granulocytes 01/09/2023 0.01  0.00 - 0.07 K/uL Final   Performed at [facility], [address], [place], [place] [address]  Office Visit on 12/11/2022  Component Date Value Ref Range Status   Rapid Strep A Screen 12/11/2022 Negative  Negative Final  Admission on 12/03/2022, Discharged on 12/03/2022  Component Date Value Ref Range Status   WBC 12/03/2022 8.8  4.0 - 10.5 K/uL Final   RBC 12/03/2022 4.33  3.87 - 5.11 MIL/uL Final   Hemoglobin 12/03/2022 14.0  12.0 - 15.0 g/dL Final   HCT 44/06/270 41.9  36.0 - 46.0 % Final   MCV 12/03/2022 96.8  80.0 - 100.0 fL Final   MCH 12/03/2022 32.3  26.0 - 34.0 pg Final   MCHC 12/03/2022 33.4  30.0 - 36.0 g/dL Final   RDW 53/66/4403 13.2  11.5 - 15.5 % Final   Platelets 12/03/2022 350  150 - 400 K/uL Final   nRBC 12/03/2022 0.0  0.0 - 0.2 % Final   Neutrophils Relative % 12/03/2022 43  % Final   Neutro Abs 12/03/2022 3.8  1.7 - 7.7 K/uL Final   Lymphocytes Relative 12/03/2022 42  % Final   Lymphs Abs 12/03/2022 3.7  0.7 - 4.0 K/uL Final   Monocytes Relative 12/03/2022 8  % Final   Monocytes Absolute 12/03/2022 0.7  0.1 - 1.0 K/uL Final  Eosinophils Relative 12/03/2022 6  % Final   Eosinophils Absolute 12/03/2022 0.5  0.0 - 0.5 K/uL Final   Basophils Relative 12/03/2022 1  % Final   Basophils Absolute 12/03/2022 0.1  0.0 - 0.1 K/uL Final   Immature Granulocytes 12/03/2022 0  % Final    Abs Immature Granulocytes 12/03/2022 0.02  0.00 - 0.07 K/uL Final   Performed at [facility], [address] [place], [place] [address]   Sodium 12/03/2022 137  135 - 145 mmol/L Final   Potassium 12/03/2022 3.7  3.5 - 5.1 mmol/L Final   Chloride 12/03/2022 100  98 - 111 mmol/L Final   CO2 12/03/2022 27  22 - 32 mmol/L Final   Glucose, Bld 12/03/2022 91  70 - 99 mg/dL Final   Glucose reference range applies only to samples taken after fasting for at least 8 hours.   BUN 12/03/2022 15  6 - 20 mg/dL Final   Creatinine, Ser 12/03/2022 1.18 (H)  0.44 - 1.00 mg/dL Final   Calcium 60/45/4098 8.5 (L)  8.9 - 10.3 mg/dL Final   Total Protein 11/91/4782 7.0  6.5 - 8.1 g/dL Final   Albumin 95/62/1308 3.7  3.5 - 5.0 g/dL Final   AST 65/78/4696 19  15 - 41 U/L Final   ALT 12/03/2022 17  0 - 44 U/L Final   Alkaline Phosphatase 12/03/2022 97  38 - 126 U/L Final   Total Bilirubin 12/03/2022 0.5  0.3 - 1.2 mg/dL Final   GFR, Estimated 12/03/2022 55 (L)  >60 mL/min Final   Comment: (NOTE) Calculated using the CKD-EPI Creatinine Equation (2021)    Anion gap 12/03/2022 10  5 - 15 Final   Performed at [facility], [address] [place], [place] [address]   Lipase 12/03/2022 38  11 - 51 U/L Final   Performed at [facility], [address] [place], [place] [address]   Color, Urine 12/03/2022 YELLOW  YELLOW Final   APPearance 12/03/2022 CLEAR  CLEAR Final   Specific Gravity, Urine 12/03/2022 1.015  1.005 - 1.030 Final   pH 12/03/2022 6.0  5.0 - 8.0 Final   Glucose, UA 12/03/2022 NEGATIVE  NEGATIVE mg/dL Final   Hgb urine dipstick 12/03/2022 TRACE (A)  NEGATIVE Final   Bilirubin Urine 12/03/2022 NEGATIVE  NEGATIVE Final   Ketones, ur 12/03/2022 NEGATIVE  NEGATIVE mg/dL Final   Protein, ur 40/03/2724 NEGATIVE  NEGATIVE mg/dL Final   Nitrite 36/64/4034 NEGATIVE  NEGATIVE Final   Leukocytes,Ua 12/03/2022 TRACE (A)  NEGATIVE Final   Performed at [facility],  [address] [place], [place] [address]   RBC / HPF 12/03/2022 NONE SEEN  0 - 5 RBC/hpf Final   WBC, UA 12/03/2022 0-5  0 - 5 WBC/hpf Final   Bacteria, UA 12/03/2022 NONE SEEN  NONE SEEN Final   Squamous Epithelial / HPF 12/03/2022 0-5  0 - 5 /HPF Final   Performed at [facility], [address] [place], [place] [address]  Office Visit on 11/20/2022  Component Date Value Ref Range Status   WBC 11/20/2022 9.2  3.4 - 10.8 x10E3/uL Final   RBC 11/20/2022 4.63  3.77 - 5.28 x10E6/uL Final   Hemoglobin 11/20/2022 14.8  11.1 - 15.9 g/dL Final   Hematocrit 56/43/3295 44.6  34.0 - 46.6 % Final   MCV 11/20/2022 96  79 - 97 fL Final   MCH 11/20/2022 32.0  26.6 - 33.0  pg Final   MCHC 11/20/2022 33.2  31.5 - 35.7 g/dL Final   RDW 09/81/1914 13.0  11.7 - 15.4 % Final   Platelets 11/20/2022 341  150 - 450 x10E3/uL Final   Neutrophils 11/20/2022 56  Not Estab. % Final   Lymphs 11/20/2022 32  Not Estab. % Final   Monocytes 11/20/2022 8  Not Estab. % Final   Eos 11/20/2022 3  Not Estab. % Final   Basos 11/20/2022 1  Not Estab. % Final   Neutrophils Absolute 11/20/2022 5.2  1.4 - 7.0 x10E3/uL Final   Lymphocytes Absolute 11/20/2022 3.0  0.7 - 3.1 x10E3/uL Final   Monocytes Absolute 11/20/2022 0.7  0.1 - 0.9 x10E3/uL Final   EOS (ABSOLUTE) 11/20/2022 0.3  0.0 - 0.4 x10E3/uL Final   Basophils Absolute 11/20/2022 0.1  0.0 - 0.2 x10E3/uL Final   Immature Granulocytes 11/20/2022 0  Not Estab. % Final   Immature Grans (Abs) 11/20/2022 0.0  0.0 - 0.1 x10E3/uL Final   TSH 11/20/2022 2.330  0.450 - 4.500 uIU/mL Final   Vit D, 25-Hydroxy 11/20/2022 15.7 (L)  30.0 - 100.0 ng/mL Final   Comment: Vitamin D deficiency has been defined by the Institute of Medicine and an Endocrine Society practice guideline as a level of serum 25-OH vitamin D less than 20 ng/mL (1,2). The Endocrine Society went on to further define vitamin D insufficiency as a level between 21 and 29 ng/mL (2). 1. IOM (Institute  of Medicine). 2010. Dietary reference    intakes for calcium and D. [place]: The    [employer]. 2. Holick MF, B[address]ley [address], Bischoff-Ferrari HA, et al.    Evaluation, treatment, and prevention of vitamin D    deficiency: an Endocrine Society clinical practice    guideline. JCEM. 2011 Jul; 96(7):1911-30.    Neisseria Gonorrhea 11/20/2022 Negative   Final   Chlamydia 11/20/2022 Negative   Final   Trichomonas 11/20/2022 Negative   Final   Bacterial Vaginitis (gardnerella) 11/20/2022 Positive (A)   Final   Candida Vaginitis 11/20/2022 Negative   Final   Candida Glabrata 11/20/2022 Negative   Final   Comment 11/20/2022 Normal Reference Range Bacterial Vaginosis - Negative   Final   Comment 11/20/2022 Normal Reference Ranger Chlamydia - Negative   Final   Comment 11/20/2022 Normal Reference Range Neisseria Gonorrhea - Negative   Final   Comment 11/20/2022 Normal Reference Range Candida Species - Negative   Final   Comment 11/20/2022 Normal Reference Range Candida Galbrata - Negative   Final   Comment 11/20/2022 Normal Reference Range Trichomonas - Negative   Final   RPR Ser Ql 11/20/2022 Non Reactive  Non Reactive Final   HIV Screen 4th Generation wRfx 11/20/2022 Non Reactive  Non Reactive Final   Comment: HIV Negative HIV-1/HIV-2 antibodies and HIV-1 p24 antigen were NOT detected. There is no laboratory evidence of HIV infection.    HCV Ab 11/20/2022 Non Reactive  Non Reactive Final   Glucose 11/20/2022 82  70 - 99 mg/dL Final   BUN 78/29/5621 17  6 - 24 mg/dL Final   Creatinine, Ser 11/20/2022 0.85  0.57 - 1.00 mg/dL Final   eGFR 30/86/5784 81  >59 mL/min/1.73 Final   BUN/Creatinine Ratio 11/20/2022 20  9 - 23 Final   Sodium 11/20/2022 142  134 - 144 mmol/L Final   Potassium 11/20/2022 4.3  3.5 - 5.2 mmol/L Final   Chloride 11/20/2022 104  96 - 106 mmol/L Final   Calcium 11/20/2022 9.1  8.7 -  10.2 mg/dL Final   Total Protein 40/98/1191 7.0  6.0 - 8.5 g/dL Final    Albumin 47/82/9562 4.3  3.8 - 4.9 g/dL Final   Globulin, Total 11/20/2022 2.7  1.5 - 4.5 g/dL Final   Albumin/Globulin Ratio 11/20/2022 1.6  1.2 - 2.2 Final   Bilirubin Total 11/20/2022 <0.2  0.0 - 1.2 mg/dL Final   Alkaline Phosphatase 11/20/2022 112  44 - 121 IU/L Final   AST 11/20/2022 17  0 - 40 IU/L Final   Urine Culture, Routine 11/20/2022 Final report   Final   Organism ID, Bacteria 11/20/2022 Comment   Final   Comment: Mixed urogenital flora 10,000-25,000 colony forming units per mL    Color, UA 11/20/2022 yellow  yellow Final   Clarity, UA 11/20/2022 clear  clear Final   Glucose, UA 11/20/2022 negative  negative mg/dL Final   Bilirubin, UA 13/01/6577 negative  negative Final   Ketones, POC UA 11/20/2022 negative  negative mg/dL Final   Spec Grav, UA 46/96/2952 1.025  1.010 - 1.025 Final   Blood, UA 11/20/2022 trace-intact (A)  negative Final   pH, UA 11/20/2022 6.0  5.0 - 8.0 Final   POC PROTEIN,UA 11/20/2022 negative  negative, trace Final   Urobilinogen, UA 11/20/2022 0.2  0.2 or 1.0 E.U./dL Final   Nitrite, UA 84/13/2440 Negative  Negative Final   Leukocytes, UA 11/20/2022 Small (1+) (A)  Negative Final   HCV Interp 1: 11/20/2022 Comment   Final   Comment: Not infected with HCV unless early or acute infection is suspected (which may be delayed in an immunocompromised individual), or other evidence exists to indicate HCV infection.   Admission on 09/27/2022, Discharged on 09/27/2022  Component Date Value Ref Range Status   Sodium 09/27/2022 134 (L)  135 - 145 mmol/L Final   Potassium 09/27/2022 4.2  3.5 - 5.1 mmol/L Final   Chloride 09/27/2022 100  98 - 111 mmol/L Final   CO2 09/27/2022 27  22 - 32 mmol/L Final   Glucose, Bld 09/27/2022 88  70 - 99 mg/dL Final   Glucose reference range applies only to samples taken after fasting for at least 8 hours.   BUN 09/27/2022 13  6 - 20 mg/dL Final   Creatinine, Ser 09/27/2022 0.82  0.44 - 1.00 mg/dL Final   Calcium 04/15/2535  8.7 (L)  8.9 - 10.3 mg/dL Final   GFR, Estimated 09/27/2022 >60  >60 mL/min Final   Comment: (NOTE) Calculated using the CKD-EPI Creatinine Equation (2021)    Anion gap 09/27/2022 7  5 - 15 Final   Performed at [facility], [address] [place], [place] [address]   WBC 09/27/2022 7.5  4.0 - 10.5 K/uL Final   RBC 09/27/2022 4.71  3.87 - 5.11 MIL/uL Final   Hemoglobin 09/27/2022 15.0  12.0 - 15.0 g/dL Final   HCT 47/42/5956 45.3  36.0 - 46.0 % Final   MCV 09/27/2022 96.2  80.0 - 100.0 fL Final   MCH 09/27/2022 31.8  26.0 - 34.0 pg Final   MCHC 09/27/2022 33.1  30.0 - 36.0 g/dL Final   RDW 38/75/6433 13.9  11.5 - 15.5 % Final   Platelets 09/27/2022 349  150 - 400 K/uL Final   nRBC 09/27/2022 0.0  0.0 - 0.2 % Final   Performed at [facility], [address] [place], [place] [address]   Troponin I (High Sensitivity) 09/27/2022 <2  <18 ng/L Final   Comment: (NOTE) Elevated high  sensitivity troponin I (hsTnI) values and significant  changes across serial measurements may suggest ACS but many other  chronic and acute conditions are known to elevate hsTnI results.  Refer to the "Links" section for chest pain algorithms and additional  guidance. Performed at [facility], [address] [place], [place] [address]    Troponin I (High Sensitivity) 09/27/2022 2  <18 ng/L Final   Comment: (NOTE) Elevated high sensitivity troponin I (hsTnI) values and significant  changes across serial measurements may suggest ACS but many other  chronic and acute conditions are known to elevate hsTnI results.  Refer to the "Links" section for chest pain algorithms and additional  guidance. Performed at [facility], [address] [place], [place] [address]     Blood Alcohol level:  Lab Results  Component Value Date   ETH 45 (H) 01/09/2023   ETH <10 07/07/2021    Metabolic Disorder Labs: No results found for: "HGBA1C", "MPG" No results found for:  "PROLACTIN" Lab Results  Component Value Date   CHOL 185 01/10/2023   TRIG 74 01/10/2023   HDL 68 01/10/2023   CHOLHDL 2.7 01/10/2023   VLDL 15 01/10/2023   LDLCALC 102 (H) 01/10/2023    Therapeutic Lab Levels: No results found for: "LITHIUM" No results found for: "VALPROATE" No results found for: "CBMZ"  Physical Findings   AUDIT    Flowsheet Row ED from 01/09/2023 in [facility]  Alcohol Use Disorder Identification Test Final Score (AUDIT) 40      PHQ2-9    Flowsheet Row ED from 01/09/2023 in [facility]  PHQ-2 Total Score 6  PHQ-9 Total Score 22      Flowsheet Row ED from 01/09/2023 in [facility] Most recent reading at 01/09/2023  5:00 PM ED from 01/09/2023 in [facility] Most recent reading at 01/09/2023  6:00 AM ED from 12/03/2022 in [facility] Department at [facility] Most recent reading at 12/03/2022  7:31 PM  C-SSRS RISK CATEGORY No Risk Moderate Risk No Risk        Musculoskeletal  Strength & Muscle Tone: within normal limits Gait & Station: normal Patient leans: N/A  Psychiatric Specialty Exam  Presentation  General Appearance:  Disheveled  Eye Contact: Fair  Speech: Clear and Coherent  Speech Volume: Normal  Handedness:No data recorded  Mood and Affect  Mood: Depressed  Affect: Congruent   Thought Process  Thought Processes: Coherent  Descriptions of Associations:Intact  Orientation:Full (Time, Place and Person)  Thought Content:Logical; WDL     Hallucinations:Hallucinations: Visual Description of Visual Hallucinations: Described seeing her son (who passed away) wherever she looks -- does not believe him to be real  Ideas of Reference:None  Suicidal Thoughts:Suicidal Thoughts: No SI Passive Intent and/or Plan: Without Intent; Without Plan  Homicidal Thoughts:Homicidal  Thoughts: No   Sensorium  Memory: Immediate Good; Recent Good  Judgment: Fair  Insight: Fair   Executive Functions  Concentration: Good  Attention Span: Good  Recall: Good  Fund of Knowledge: Fair  Language: Fair   Psychomotor Activity  Psychomotor Activity: Psychomotor Activity: Normal   Assets  Assets: Desire for Improvement; Physical Health; Resilience   Sleep  Sleep: Sleep: Fair   No data recorded  Physical Exam  Physical Exam Vitals reviewed.  Constitutional:      General: She is not in acute distress.    Comments: Older  than stated age  HENT:     Head: Normocephalic and atraumatic.  Eyes:     Extraocular Movements: Extraocular movements intact.     Pupils: Pupils are equal, round, and reactive to light.  Pulmonary:     Effort: Pulmonary effort is normal. No respiratory distress.  Abdominal:     General: Abdomen is flat.  Musculoskeletal:        General: Normal range of motion.  Neurological:     Mental Status: She is alert and oriented to person, place, and time. Mental status is at baseline.  Psychiatric:        Mood and Affect: Mood normal.        Behavior: Behavior normal.        Thought Content: Thought content normal.    Review of Systems  Psychiatric/Behavioral:  Positive for substance abuse. Negative for hallucinations and suicidal ideas. The patient is nervous/anxious and has insomnia.   All other systems reviewed and are negative.  Blood pressure 117/88, pulse 83, temperature 97.6 F (36.4 C), temperature source Oral, resp. rate 19, SpO2 98%. There is no height or weight on file to calculate BMI.  Treatment Plan Summary: Daily contact with patient to assess and evaluate symptoms and progress in treatment and Medication management  Jannetta Massey is a 57 year old female with a complex past psychiatric history including multiple use disorders (alcohol, cocaine, methamphetamine) and multifactorial PTSD (CSA, bereavement) as  well as significant, ongoing social stressors and longstanding passive SI who presents for alcohol detox.   Patient presents psychiatric baseline today.  No new complaints.  Received lab work this morning, fasting lipids within normal limits.  UA unremarkable.  Awaiting hepatitis panel and HIV labs.  Mood is somewhat better today.  Likeliest discharge option is to [facility] ([place]) inpatient rehab.  Change of venue likely better for patient as she has known drug contacts in [facility], which helped lead to her relapse.  Patient reports full-body itching resolved with 25 mg hydroxyzine.  Will start naltrexone 50 mg today for alcohol cravings.  Patient had not heard of this medication before.  No SI, HI.  We will continue to monitor persistent visual hallucinations, although likely secondary to multifactorial PTSD history.  No new concerns.  #Alcohol use disorders, severe, in withdrawal #Stimulant use disorder, methamphetamine and cocaine types, in withdrawal #Substance-induced mood disorder - Continue CIWA protocol.  Last CIWAs: 5, 0. - Continue 4-day Librium 25 mg taper (7/23 - 7/27).  Begin at 4 times a day, with 1 fewer scheduled dose each following day. - Continue Librium 25 mg as needed every 6 hours for CIWA scores greater than 10. - Continue duloxetine 30 mg daily to target mood and neuropathic pain. - Continue gabapentin 300 mg 3 times daily for anxiety and mood stabilization. - Continue hydroxyzine 25 mg as needed every 6 hours for anxiety/agitation/pruritus. - Continue loperamide 2 to 4 mg as needed for diarrhea/loose stools. - Continue magnesium hydroxide 30 mL daily as needed for mild constipation. - Continue Zofran 4 mg every 6 hours as needed for nausea and vomiting. - Give vitamin B1 injection 100 mg once. - Continue thiamine supplementation 100 mg p.o. daily. - Continue multivitamin daily. - Begin naltrexone 50 mg daily for alcohol cravings.  LFTs within normal limits.   #Nicotine use  disorder - Continue NicoDerm patch 21 mg.   #Posttraumatic stress disorder, multifactorial - Discuss plan for psychiatric follow-up after discharge from long-term substance use treatment. - Consider prazosin for nightmares across stay.  No nightmares today.   #Pruritus - Continue as needed hydroxyzine 25 mg.  Appears to be effective.   #Dispo - Will send out patient information for long term outpatient substance use treatment.  Likely LOS 3-5 days.  Tomie China, MD 01/10/2023 8:54 AM

## 2023-01-10 NOTE — ED Notes (Signed)
Pt was informed it was lunch time but declined lunch.

## 2023-01-11 ENCOUNTER — Encounter (HOSPITAL_COMMUNITY): Payer: Self-pay | Admitting: Psychiatry

## 2023-01-11 LAB — GC/CHLAMYDIA PROBE AMP (~~LOC~~) NOT AT ARMC
Chlamydia: NEGATIVE
Comment: NEGATIVE
Comment: NORMAL
Neisseria Gonorrhea: NEGATIVE

## 2023-01-11 MED ORDER — CHOLECALCIFEROL 10 MCG (400 UNIT) PO TABS: 800.0000 [IU] | ORAL_TABLET | Freq: Every day | ORAL | 0 refills | Status: DC

## 2023-01-11 MED ORDER — NICOTINE 21 MG/24HR TD PT24
21.0000 mg | MEDICATED_PATCH | Freq: Every day | TRANSDERMAL | 0 refills | Status: AC
Start: 2023-01-11 — End: 2023-02-10

## 2023-01-11 MED ORDER — GABAPENTIN 300 MG PO CAPS: 300.0000 mg | ORAL_CAPSULE | Freq: Three times a day (TID) | ORAL | 0 refills | Status: DC

## 2023-01-11 MED ORDER — ADULT MULTIVITAMIN W/MINERALS CH: 1.0000 | ORAL_TABLET | Freq: Every day | ORAL | Status: AC

## 2023-01-11 MED ORDER — CHLORDIAZEPOXIDE HCL 25 MG PO CAPS
25.0000 mg | ORAL_CAPSULE | Freq: Once | ORAL | Status: AC
  Administered 2023-01-11: 25 mg via ORAL
  Filled 2023-01-11: qty 1

## 2023-01-11 MED ORDER — CHOLECALCIFEROL 10 MCG (400 UNIT) PO TABS
800.0000 [IU] | ORAL_TABLET | Freq: Every day | ORAL | Status: DC
  Administered 2023-01-11 – 2023-01-12 (×2): 800 [IU] via ORAL
  Filled 2023-01-11: qty 28
  Filled 2023-01-11 (×2): qty 2

## 2023-01-11 MED ORDER — DULOXETINE HCL 30 MG PO CPEP: 30.0000 mg | ORAL_CAPSULE | Freq: Every day | ORAL | 0 refills | Status: DC

## 2023-01-11 MED ORDER — VITAMIN B-1 100 MG PO TABS: 100.0000 mg | ORAL_TABLET | Freq: Every day | ORAL | 0 refills | Status: AC

## 2023-01-11 MED ORDER — NALTREXONE HCL 50 MG PO TABS: 50.0000 mg | ORAL_TABLET | Freq: Every day | ORAL | 0 refills | Status: AC

## 2023-01-11 MED ORDER — CHOLECALCIFEROL 10 MCG (400 UNIT) PO TABS: 400.0000 [IU] | ORAL_TABLET | Freq: Every day | ORAL | Status: DC

## 2023-01-11 NOTE — ED Notes (Signed)
Rn asked Lauren Blackwell which [facility] the patient was going to and she states that she is going to [facility] ,[place]

## 2023-01-11 NOTE — Group Note (Signed)
Group Topic: Communication  Group Date: 01/11/2023 Start Time: 1300 End Time: 1320 Facilitators: Merrie Roof, RN  Department: [facility]  Number of Participants: 7  Group Focus: coping skills Treatment Modality:  Patient-Centered Therapy Interventions utilized were assignment Purpose: express feelings  Name: Lauren Blackwell Date of Birth: 05-Feb-1966  MR: [id number]    Level of Participation: active Quality of Participation: cooperative Interactions with others: gave feedback Mood/Affect: appropriate Triggers (if applicable):  Cognition: goal directed Progress: Significant Response:  Plan: patient will be encouraged to continue with theapy  Patients Problems:  Patient Active Problem List   Diagnosis Date Noted   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/2023   Alcohol-induced mood disorder (HCC) 01/09/2023   Alcohol use disorder 01/09/2023   Other emphysema (HCC) 11/21/2022   Aortic atherosclerosis (HCC) 11/21/2022   Right lower lobe pulmonary nodule 11/21/2022   GAD (generalized anxiety disorder) 11/21/2022   Bipolar disorder, in full remission, most recent episode mixed (HCC) 11/21/2022   Tobacco abuse 11/21/2022   Alcohol abuse 11/21/2022   Cocaine abuse (HCC) 11/21/2022   Methamphetamine abuse (HCC) 11/21/2022   Acute hypoxemic respiratory failure (HCC) 07/08/2021   Methamphetamine use disorder, severe, dependence (HCC) 07/08/2021   Opioid overdose (HCC) 07/08/2021   Respiratory failure (HCC) 07/08/2021

## 2023-01-11 NOTE — ED Provider Notes (Signed)
Behavioral Health Progress Note  Date and Time: 01/11/2023 8:34 AM Name: Lauren Blackwell MRN:  [id number]  Subjective: Vitals within normal limits.  No acute events overnight.  Labs: HIV negative, Hepatitis panel negative, fasting lipid panel unremarkable, awaiting RPR.  No refusals.  PRNs: Tylenol 650 mg x 1.  CIWAs: 2, 1, 0.  On Librium taper day 2 of 4.  On interview, patient is excited that she was able to get a good night sleep.  "I slept the whole time."  Describes mood as "better than yesterday."  She says that she had been sleeping for a month on a concrete floor at the [facility], and that having a safe, real bed makes her feel a lot better.  Patient currently has a petition for disability for a "bone disease" that she does not remember the name of.  Mentioned that she has had a vitamin D deficiency in the past.  Denied HI, SI, and AVH.  Said she had trouble reading with the glasses that we had given her, and asked for a new pair.  Diagnosis:  Final diagnoses:  Alcohol-induced mood disorder with depressive symptoms (HCC)  Alcohol use disorder  Stimulant use disorder  Cannabis abuse  Cigarette nicotine dependence with withdrawal    Total Time spent with patient: 15 minutes  Past Psychiatric History: Patient has a very complex PTSD history which includes hypervigilance secondary to major trauma.  Has primary psychiatric diagnoses, however it is difficult to pin down the validity of these as she has never had a significant period in her life where she has not used psychedelic drugs.  Patient endorses having been put on gabapentin in the past for mood stabilization and anxiety.  Past Medical History: Per chart review, patient was recently treated for complicated cystitis secondary to UTI, prescribed Keflex.  She also has a long history of numerous fractures secondary to falls. Family History: Multiple members completed suicide.  Patient describes a history of CSA. Social History: Homeless since  February 2021.  Has been seen in multiple shelters.  No current social support.  Has Medicaid.  Undergoing disability claim currently.  Additional Social History:                         Sleep: Fair  Appetite:  Good  Current Medications:  Current Facility-Administered Medications  Medication Dose Route Frequency Provider Last Rate Last Admin   acetaminophen (TYLENOL) tablet 650 mg  650 mg Oral Q6H PRN Ardis Hughs, NP   650 mg at 01/10/23 0901   alum & mag hydroxide-simeth (MAALOX/MYLANTA) 200-200-20 MG/5ML suspension 30 mL  30 mL Oral Q4H PRN Ardis Hughs, NP       chlordiazePOXIDE (LIBRIUM) capsule 25 mg  25 mg Oral Q6H PRN Tomie China, MD       chlordiazePOXIDE (LIBRIUM) capsule 25 mg  25 mg Oral TID Tomie China, MD   25 mg at 01/10/23 2138   Followed by   chlordiazePOXIDE (LIBRIUM) capsule 25 mg  25 mg Oral Reginal Lutes, MD       Followed by   Melene Muller ON 01/12/2023] chlordiazePOXIDE (LIBRIUM) capsule 25 mg  25 mg Oral Daily Tomie China, MD       DULoxetine (CYMBALTA) DR capsule 30 mg  30 mg Oral Daily Ardis Hughs, NP   30 mg at 01/10/23 0857   gabapentin (NEURONTIN) capsule 300 mg  300 mg Oral TID Ardis Hughs, NP   300 mg at  01/10/23 2138   hydrOXYzine (ATARAX) tablet 25 mg  25 mg Oral Q6H PRN Ardis Hughs, NP   25 mg at 01/09/23 1549   loperamide (IMODIUM) capsule 2-4 mg  2-4 mg Oral PRN Ardis Hughs, NP       loratadine (CLARITIN) tablet 10 mg  10 mg Oral Daily PRN Ardis Hughs, NP       magnesium hydroxide (MILK OF MAGNESIA) suspension 30 mL  30 mL Oral Daily PRN Ardis Hughs, NP       multivitamin with minerals tablet 1 tablet  1 tablet Oral Daily Ardis Hughs, NP   1 tablet at 01/10/23 0857   naltrexone (DEPADE) tablet 50 mg  50 mg Oral Daily Tomie China, MD   50 mg at 01/10/23 0901   nicotine (NICODERM CQ - dosed in mg/24 hours) patch 21 mg  21 mg Transdermal Daily Ardis Hughs, NP   21 mg at 01/10/23 0858   ondansetron (ZOFRAN-ODT) disintegrating tablet 4 mg  4 mg Oral Q6H PRN Ardis Hughs, NP       thiamine (VITAMIN B1) tablet 100 mg  100 mg Oral Daily Ardis Hughs, NP   100 mg at 01/10/23 4098   Current Outpatient Medications  Medication Sig Dispense Refill   albuterol (VENTOLIN HFA) 108 (90 Base) MCG/ACT inhaler Inhale 1-2 puffs into the lungs every 6 (six) hours as needed for wheezing or shortness of breath. 6.7 g 1   budesonide-formoterol (SYMBICORT) 160-4.5 MCG/ACT inhaler Inhale 2 puffs into the lungs 2 (two) times daily. 1 each 1   cetirizine (ZYRTEC ALLERGY) 10 MG tablet Take 1 tablet (10 mg total) by mouth daily. 30 tablet 1   DULoxetine (CYMBALTA) 30 MG capsule Take 30 mg by mouth daily.     gabapentin (NEURONTIN) 300 MG capsule Take 300 mg by mouth 3 (three) times daily.     nicotine (NICODERM CQ - DOSED IN MG/24 HOURS) 21 mg/24hr patch Place 1 patch (21 mg total) onto the skin daily. 28 patch 0   Vitamin D, Ergocalciferol, (DRISDOL) 1.25 MG (50000 UNIT) CAPS capsule Take 1 capsule (50,000 Units total) by mouth every 7 (seven) days. (Patient taking differently: Take 50,000 Units by mouth every Thursday.) 4 capsule 2    Labs  Lab Results:  Admission on 01/09/2023  Component Date Value Ref Range Status   Color, Urine 01/10/2023 YELLOW  YELLOW Final   APPearance 01/10/2023 CLEAR  CLEAR Final   Specific Gravity, Urine 01/10/2023 1.012  1.005 - 1.030 Final   pH 01/10/2023 5.0  5.0 - 8.0 Final   Glucose, UA 01/10/2023 NEGATIVE  NEGATIVE mg/dL Final   Hgb urine dipstick 01/10/2023 SMALL (A)  NEGATIVE Final   Bilirubin Urine 01/10/2023 NEGATIVE  NEGATIVE Final   Ketones, ur 01/10/2023 NEGATIVE  NEGATIVE mg/dL Final   Protein, ur 11/91/4782 NEGATIVE  NEGATIVE mg/dL Final   Nitrite 95/62/1308 NEGATIVE  NEGATIVE Final   Leukocytes,Ua 01/10/2023 NEGATIVE  NEGATIVE Final   RBC / HPF 01/10/2023 0-5  0 - 5 RBC/hpf Final   WBC, UA  01/10/2023 0-5  0 - 5 WBC/hpf Final   Bacteria, UA 01/10/2023 NONE SEEN  NONE SEEN Final   Squamous Epithelial / HPF 01/10/2023 0-5  0 - 5 /HPF Final   Mucus 01/10/2023 PRESENT   Final   Performed at [facility], [address] [place], [place] [address]   Cholesterol 01/10/2023 185  0 - 200 mg/dL Final  Triglycerides 01/10/2023 74  <150 mg/dL Final   HDL 16/03/9603 68  >40 mg/dL Final   Total CHOL/HDL Ratio 01/10/2023 2.7  RATIO Final   VLDL 01/10/2023 15  0 - 40 mg/dL Final   LDL Cholesterol 01/10/2023 102 (H)  0 - 99 mg/dL Final   Comment:        Total Cholesterol/HDL:CHD Risk Coronary Heart Disease Risk Table                     Men   Women  1/2 Average Risk   3.4   3.3  Average Risk       5.0   4.4  2 X Average Risk   9.6   7.1  3 X Average Risk  23.4   11.0        Use the calculated Patient Ratio above and the CHD Risk Table to determine the patient's CHD Risk.        ATP III CLASSIFICATION (LDL):  <100     mg/dL   Optimal  540-981  mg/dL   Near or Above                    Optimal  130-159  mg/dL   Borderline  191-478  mg/dL   High  >295     mg/dL   Very High Performed at [facility], [address], [place], [place] [address]    Hepatitis B Surface Ag 01/10/2023 NON REACTIVE  NON REACTIVE Final   HCV Ab 01/10/2023 NON REACTIVE  NON REACTIVE Final   Comment: (NOTE) Nonreactive HCV antibody screen is consistent with no HCV infections,  unless recent infection is suspected or other evidence exists to indicate HCV infection.     Hep A IgM 01/10/2023 NON REACTIVE  NON REACTIVE Final   Hep B C IgM 01/10/2023 NON REACTIVE  NON REACTIVE Final   Performed at [facility], [address] [place], [place] [address]   HIV Screen 4th Generation wRfx 01/10/2023 Non Reactive  Non Reactive Final   Performed at [facility], [address] [place], [place] [address]  Admission on 01/09/2023, Discharged on 01/09/2023  Component Date Value Ref Range  Status   Sodium 01/09/2023 139  135 - 145 mmol/L Final   Potassium 01/09/2023 3.8  3.5 - 5.1 mmol/L Final   Chloride 01/09/2023 107  98 - 111 mmol/L Final   CO2 01/09/2023 19 (L)  22 - 32 mmol/L Final   Glucose, Bld 01/09/2023 77  70 - 99 mg/dL Final   Glucose reference range applies only to samples taken after fasting for at least 8 hours.   BUN 01/09/2023 6  6 - 20 mg/dL Final   Creatinine, Ser 01/09/2023 0.57  0.44 - 1.00 mg/dL Final   Calcium 95/28/4132 8.2 (L)  8.9 - 10.3 mg/dL Final   Total Protein 44/06/270 6.4 (L)  6.5 - 8.1 g/dL Final   Albumin 53/66/4403 3.4 (L)  3.5 - 5.0 g/dL Final   AST 47/42/5956 21  15 - 41 U/L Final   ALT 01/09/2023 16  0 - 44 U/L Final   Alkaline Phosphatase 01/09/2023 98  38 - 126 U/L Final   Total Bilirubin 01/09/2023 0.3  0.3 - 1.2 mg/dL Final   GFR, Estimated 01/09/2023 >60  >60 mL/min Final   Comment: (NOTE) Calculated using the CKD-EPI Creatinine Equation (2021)    Anion gap 01/09/2023 13  5 - 15 Final  Performed at [facility], [address], [place], [place] [address]   Alcohol, Ethyl (B) 01/09/2023 45 (H)  <10 mg/dL Final   Comment: (NOTE) Lowest detectable limit for serum alcohol is 10 mg/dL.  For medical purposes only. Performed at [facility], [address] [place], [place] [address]    Opiates 01/09/2023 NONE DETECTED  NONE DETECTED Final   Cocaine 01/09/2023 POSITIVE (A)  NONE DETECTED Final   Benzodiazepines 01/09/2023 POSITIVE (A)  NONE DETECTED Final   Amphetamines 01/09/2023 POSITIVE (A)  NONE DETECTED Final   Tetrahydrocannabinol 01/09/2023 POSITIVE (A)  NONE DETECTED Final   Barbiturates 01/09/2023 NONE DETECTED  NONE DETECTED Final   Comment: (NOTE) DRUG SCREEN FOR MEDICAL PURPOSES ONLY.  IF CONFIRMATION IS NEEDED FOR ANY PURPOSE, NOTIFY LAB WITHIN 5 DAYS.  LOWEST DETECTABLE LIMITS FOR URINE DRUG SCREEN Drug Class                     Cutoff (ng/mL) Amphetamine and metabolites    1000 Barbiturate  and metabolites    200 Benzodiazepine                 200 Opiates and metabolites        300 Cocaine and metabolites        300 THC                            50 Performed at [facility], [address], [place], [place] [address]    WBC 01/09/2023 5.3  4.0 - 10.5 K/uL Final   RBC 01/09/2023 4.50  3.87 - 5.11 MIL/uL Final   Hemoglobin 01/09/2023 14.9  12.0 - 15.0 g/dL Final   HCT 16/96/7893 44.5  36.0 - 46.0 % Final   MCV 01/09/2023 98.9  80.0 - 100.0 fL Final   MCH 01/09/2023 33.1  26.0 - 34.0 pg Final   MCHC 01/09/2023 33.5  30.0 - 36.0 g/dL Final   RDW 81/06/7508 14.5  11.5 - 15.5 % Final   Platelets 01/09/2023 354  150 - 400 K/uL Final   nRBC 01/09/2023 0.0  0.0 - 0.2 % Final   Neutrophils Relative % 01/09/2023 46  % Final   Neutro Abs 01/09/2023 2.5  1.7 - 7.7 K/uL Final   Lymphocytes Relative 01/09/2023 39  % Final   Lymphs Abs 01/09/2023 2.1  0.7 - 4.0 K/uL Final   Monocytes Relative 01/09/2023 10  % Final   Monocytes Absolute 01/09/2023 0.6  0.1 - 1.0 K/uL Final   Eosinophils Relative 01/09/2023 4  % Final   Eosinophils Absolute 01/09/2023 0.2  0.0 - 0.5 K/uL Final   Basophils Relative 01/09/2023 1  % Final   Basophils Absolute 01/09/2023 0.1  0.0 - 0.1 K/uL Final   Immature Granulocytes 01/09/2023 0  % Final   Abs Immature Granulocytes 01/09/2023 0.01  0.00 - 0.07 K/uL Final   Performed at [facility], [address] [place], [place] [address]  Office Visit on 12/11/2022  Component Date Value Ref Range Status   Rapid Strep A Screen 12/11/2022 Negative  Negative Final  Admission on 12/03/2022, Discharged on 12/03/2022  Component Date Value Ref Range Status   WBC 12/03/2022 8.8  4.0 - 10.5 K/uL Final   RBC 12/03/2022 4.33  3.87 - 5.11 MIL/uL Final   Hemoglobin 12/03/2022 14.0  12.0 - 15.0 g/dL Final   HCT 77/82/4235 41.9  36.0 -  46.0 % Final   MCV 12/03/2022 96.8  80.0 - 100.0 fL Final   MCH 12/03/2022 32.3  26.0 - 34.0 pg Final   MCHC 12/03/2022 33.4   30.0 - 36.0 g/dL Final   RDW 16/03/9603 13.2  11.5 - 15.5 % Final   Platelets 12/03/2022 350  150 - 400 K/uL Final   nRBC 12/03/2022 0.0  0.0 - 0.2 % Final   Neutrophils Relative % 12/03/2022 43  % Final   Neutro Abs 12/03/2022 3.8  1.7 - 7.7 K/uL Final   Lymphocytes Relative 12/03/2022 42  % Final   Lymphs Abs 12/03/2022 3.7  0.7 - 4.0 K/uL Final   Monocytes Relative 12/03/2022 8  % Final   Monocytes Absolute 12/03/2022 0.7  0.1 - 1.0 K/uL Final   Eosinophils Relative 12/03/2022 6  % Final   Eosinophils Absolute 12/03/2022 0.5  0.0 - 0.5 K/uL Final   Basophils Relative 12/03/2022 1  % Final   Basophils Absolute 12/03/2022 0.1  0.0 - 0.1 K/uL Final   Immature Granulocytes 12/03/2022 0  % Final   Abs Immature Granulocytes 12/03/2022 0.02  0.00 - 0.07 K/uL Final   Performed at [facility], [address] [place], [place] [address]   Sodium 12/03/2022 137  135 - 145 mmol/L Final   Potassium 12/03/2022 3.7  3.5 - 5.1 mmol/L Final   Chloride 12/03/2022 100  98 - 111 mmol/L Final   CO2 12/03/2022 27  22 - 32 mmol/L Final   Glucose, Bld 12/03/2022 91  70 - 99 mg/dL Final   Glucose reference range applies only to samples taken after fasting for at least 8 hours.   BUN 12/03/2022 15  6 - 20 mg/dL Final   Creatinine, Ser 12/03/2022 1.18 (H)  0.44 - 1.00 mg/dL Final   Calcium 11/91/4782 8.5 (L)  8.9 - 10.3 mg/dL Final   Total Protein 95/62/1308 7.0  6.5 - 8.1 g/dL Final   Albumin 65/78/4696 3.7  3.5 - 5.0 g/dL Final   AST 29/52/8413 19  15 - 41 U/L Final   ALT 12/03/2022 17  0 - 44 U/L Final   Alkaline Phosphatase 12/03/2022 97  38 - 126 U/L Final   Total Bilirubin 12/03/2022 0.5  0.3 - 1.2 mg/dL Final   GFR, Estimated 12/03/2022 55 (L)  >60 mL/min Final   Comment: (NOTE) Calculated using the CKD-EPI Creatinine Equation (2021)    Anion gap 12/03/2022 10  5 - 15 Final   Performed at [facility], [address] [place], [place] [address]   Lipase 12/03/2022 38  11 -  51 U/L Final   Performed at [facility], [address] [place], [place] [address]   Color, Urine 12/03/2022 YELLOW  YELLOW Final   APPearance 12/03/2022 CLEAR  CLEAR Final   Specific Gravity, Urine 12/03/2022 1.015  1.005 - 1.030 Final   pH 12/03/2022 6.0  5.0 - 8.0 Final   Glucose, UA 12/03/2022 NEGATIVE  NEGATIVE mg/dL Final   Hgb urine dipstick 12/03/2022 TRACE (A)  NEGATIVE Final   Bilirubin Urine 12/03/2022 NEGATIVE  NEGATIVE Final   Ketones, ur 12/03/2022 NEGATIVE  NEGATIVE mg/dL Final   Protein, ur 36/64/4034 NEGATIVE  NEGATIVE mg/dL Final   Nitrite 74/25/9563 NEGATIVE  NEGATIVE Final   Leukocytes,Ua 12/03/2022 TRACE (A)  NEGATIVE Final   Performed at [facility], [address] [place], [place] [address]   RBC / HPF 12/03/2022 NONE SEEN  0 - 5 RBC/hpf Final   WBC, UA 12/03/2022 0-5  0 - 5 WBC/hpf Final   Bacteria, UA 12/03/2022 NONE SEEN  NONE SEEN Final   Squamous Epithelial / HPF 12/03/2022 0-5  0 - 5 /HPF Final   Performed at [facility], [address] [place], [place] [address]  Office Visit on 11/20/2022  Component Date Value Ref Range Status   WBC 11/20/2022 9.2  3.4 - 10.8 x10E3/uL Final   RBC 11/20/2022 4.63  3.77 - 5.28 x10E6/uL Final   Hemoglobin 11/20/2022 14.8  11.1 - 15.9 g/dL Final   Hematocrit 60/45/4098 44.6  34.0 - 46.6 % Final   MCV 11/20/2022 96  79 - 97 fL Final   MCH 11/20/2022 32.0  26.6 - 33.0 pg Final   MCHC 11/20/2022 33.2  31.5 - 35.7 g/dL Final   RDW 11/91/4782 13.0  11.7 - 15.4 % Final   Platelets 11/20/2022 341  150 - 450 x10E3/uL Final   Neutrophils 11/20/2022 56  Not Estab. % Final   Lymphs 11/20/2022 32  Not Estab. % Final   Monocytes 11/20/2022 8  Not Estab. % Final   Eos 11/20/2022 3  Not Estab. % Final   Basos 11/20/2022 1  Not Estab. % Final   Neutrophils Absolute 11/20/2022 5.2  1.4 - 7.0 x10E3/uL Final   Lymphocytes Absolute 11/20/2022 3.0  0.7 - 3.1 x10E3/uL Final   Monocytes Absolute  11/20/2022 0.7  0.1 - 0.9 x10E3/uL Final   EOS (ABSOLUTE) 11/20/2022 0.3  0.0 - 0.4 x10E3/uL Final   Basophils Absolute 11/20/2022 0.1  0.0 - 0.2 x10E3/uL Final   Immature Granulocytes 11/20/2022 0  Not Estab. % Final   Immature Grans (Abs) 11/20/2022 0.0  0.0 - 0.1 x10E3/uL Final   TSH 11/20/2022 2.330  0.450 - 4.500 uIU/mL Final   Vit D, 25-Hydroxy 11/20/2022 15.7 (L)  30.0 - 100.0 ng/mL Final   Comment: Vitamin D deficiency has been defined by the Institute of Medicine and an Endocrine Society practice guideline as a level of serum 25-OH vitamin D less than 20 ng/mL (1,2). The Endocrine Society went on to further define vitamin D insufficiency as a level between 21 and 29 ng/mL (2). 1. IOM (Institute of Medicine). 2010. Dietary reference    intakes for calcium and D. [place]: The    [employer]. 2. Holick MF, B[place], Bischoff-Ferrari HA, et al.    Evaluation, treatment, and prevention of vitamin D    deficiency: an Endocrine Society clinical practice    guideline. JCEM. 2011 Jul; 96(7):1911-30.    Neisseria Gonorrhea 11/20/2022 Negative   Final   Chlamydia 11/20/2022 Negative   Final   Trichomonas 11/20/2022 Negative   Final   Bacterial Vaginitis (gardnerella) 11/20/2022 Positive (A)   Final   Candida Vaginitis 11/20/2022 Negative   Final   Candida Glabrata 11/20/2022 Negative   Final   Comment 11/20/2022 Normal Reference Range Bacterial Vaginosis - Negative   Final   Comment 11/20/2022 Normal Reference Ranger Chlamydia - Negative   Final   Comment 11/20/2022 Normal Reference Range Neisseria Gonorrhea - Negative   Final   Comment 11/20/2022 Normal Reference Range Candida Species - Negative   Final   Comment 11/20/2022 Normal Reference Range Candida Galbrata - Negative   Final   Comment 11/20/2022 Normal Reference Range Trichomonas - Negative   Final   RPR Ser Ql 11/20/2022 Non Reactive  Non Reactive Final   HIV Screen 4th Generation wRfx 11/20/2022  Non  Reactive  Non Reactive Final   Comment: HIV Negative HIV-1/HIV-2 antibodies and HIV-1 p24 antigen were NOT detected. There is no laboratory evidence of HIV infection.    HCV Ab 11/20/2022 Non Reactive  Non Reactive Final   Glucose 11/20/2022 82  70 - 99 mg/dL Final   BUN 16/03/9603 17  6 - 24 mg/dL Final   Creatinine, Ser 11/20/2022 0.85  0.57 - 1.00 mg/dL Final   eGFR 54/02/8118 81  >59 mL/min/1.73 Final   BUN/Creatinine Ratio 11/20/2022 20  9 - 23 Final   Sodium 11/20/2022 142  134 - 144 mmol/L Final   Potassium 11/20/2022 4.3  3.5 - 5.2 mmol/L Final   Chloride 11/20/2022 104  96 - 106 mmol/L Final   Calcium 11/20/2022 9.1  8.7 - 10.2 mg/dL Final   Total Protein 14/78/2956 7.0  6.0 - 8.5 g/dL Final   Albumin 21/30/8657 4.3  3.8 - 4.9 g/dL Final   Globulin, Total 11/20/2022 2.7  1.5 - 4.5 g/dL Final   Albumin/Globulin Ratio 11/20/2022 1.6  1.2 - 2.2 Final   Bilirubin Total 11/20/2022 <0.2  0.0 - 1.2 mg/dL Final   Alkaline Phosphatase 11/20/2022 112  44 - 121 IU/L Final   AST 11/20/2022 17  0 - 40 IU/L Final   Urine Culture, Routine 11/20/2022 Final report   Final   Organism ID, Bacteria 11/20/2022 Comment   Final   Comment: Mixed urogenital flora 10,000-25,000 colony forming units per mL    Color, UA 11/20/2022 yellow  yellow Final   Clarity, UA 11/20/2022 clear  clear Final   Glucose, UA 11/20/2022 negative  negative mg/dL Final   Bilirubin, UA 84/69/6295 negative  negative Final   Ketones, POC UA 11/20/2022 negative  negative mg/dL Final   Spec Grav, UA 28/41/3244 1.025  1.010 - 1.025 Final   Blood, UA 11/20/2022 trace-intact (A)  negative Final   pH, UA 11/20/2022 6.0  5.0 - 8.0 Final   POC PROTEIN,UA 11/20/2022 negative  negative, trace Final   Urobilinogen, UA 11/20/2022 0.2  0.2 or 1.0 E.U./dL Final   Nitrite, UA 06/21/7251 Negative  Negative Final   Leukocytes, UA 11/20/2022 Small (1+) (A)  Negative Final   HCV Interp 1: 11/20/2022 Comment   Final   Comment: Not  infected with HCV unless early or acute infection is suspected (which may be delayed in an immunocompromised individual), or other evidence exists to indicate HCV infection.   Admission on 09/27/2022, Discharged on 09/27/2022  Component Date Value Ref Range Status   Sodium 09/27/2022 134 (L)  135 - 145 mmol/L Final   Potassium 09/27/2022 4.2  3.5 - 5.1 mmol/L Final   Chloride 09/27/2022 100  98 - 111 mmol/L Final   CO2 09/27/2022 27  22 - 32 mmol/L Final   Glucose, Bld 09/27/2022 88  70 - 99 mg/dL Final   Glucose reference range applies only to samples taken after fasting for at least 8 hours.   BUN 09/27/2022 13  6 - 20 mg/dL Final   Creatinine, Ser 09/27/2022 0.82  0.44 - 1.00 mg/dL Final   Calcium 66/44/0347 8.7 (L)  8.9 - 10.3 mg/dL Final   GFR, Estimated 09/27/2022 >60  >60 mL/min Final   Comment: (NOTE) Calculated using the CKD-EPI Creatinine Equation (2021)    Anion gap 09/27/2022 7  5 - 15 Final   Performed at [facility], [address] [place], [place] [address]   WBC 09/27/2022 7.5  4.0 - 10.5  K/uL Final   RBC 09/27/2022 4.71  3.87 - 5.11 MIL/uL Final   Hemoglobin 09/27/2022 15.0  12.0 - 15.0 g/dL Final   HCT 40/98/1191 45.3  36.0 - 46.0 % Final   MCV 09/27/2022 96.2  80.0 - 100.0 fL Final   MCH 09/27/2022 31.8  26.0 - 34.0 pg Final   MCHC 09/27/2022 33.1  30.0 - 36.0 g/dL Final   RDW 47/82/9562 13.9  11.5 - 15.5 % Final   Platelets 09/27/2022 349  150 - 400 K/uL Final   nRBC 09/27/2022 0.0  0.0 - 0.2 % Final   Performed at [facility], [address] [place], [place] [address]   Troponin I (High Sensitivity) 09/27/2022 <2  <18 ng/L Final   Comment: (NOTE) Elevated high sensitivity troponin I (hsTnI) values and significant  changes across serial measurements may suggest ACS but many other  chronic and acute conditions are known to elevate hsTnI results.  Refer to the "Links" section for chest pain algorithms and additional   guidance. Performed at [facility], [address] [place], [place] [address]    Troponin I (High Sensitivity) 09/27/2022 2  <18 ng/L Final   Comment: (NOTE) Elevated high sensitivity troponin I (hsTnI) values and significant  changes across serial measurements may suggest ACS but many other  chronic and acute conditions are known to elevate hsTnI results.  Refer to the "Links" section for chest pain algorithms and additional  guidance. Performed at [facility], [address] [place], [place] [address]     Blood Alcohol level:  Lab Results  Component Value Date   ETH 45 (H) 01/09/2023   ETH <10 07/07/2021    Metabolic Disorder Labs: No results found for: "HGBA1C", "MPG" No results found for: "PROLACTIN" Lab Results  Component Value Date   CHOL 185 01/10/2023   TRIG 74 01/10/2023   HDL 68 01/10/2023   CHOLHDL 2.7 01/10/2023   VLDL 15 01/10/2023   LDLCALC 102 (H) 01/10/2023    Therapeutic Lab Levels: No results found for: "LITHIUM" No results found for: "VALPROATE" No results found for: "CBMZ"  Physical Findings   AUDIT    Flowsheet Row ED from 01/09/2023 in [facility]  Alcohol Use Disorder Identification Test Final Score (AUDIT) 40      PHQ2-9    Flowsheet Row ED from 01/09/2023 in [facility]  PHQ-2 Total Score 6  PHQ-9 Total Score 22      Flowsheet Row ED from 01/09/2023 in [facility] Most recent reading at 01/09/2023  5:00 PM ED from 01/09/2023 in [facility] Emergency Department at [facility] Most recent reading at 01/09/2023  6:00 AM ED from 12/03/2022 in [facility] Emergency Department at [facility] Most recent reading at 12/03/2022  7:31 PM  C-SSRS RISK CATEGORY No Risk Moderate Risk No Risk        Musculoskeletal  Strength & Muscle Tone: within normal limits Gait & Station: normal Patient leans:  N/A  Psychiatric Specialty Exam  Presentation  General Appearance:  Appropriate for Environment; Disheveled  Eye Contact: Fair  Speech: Clear and Coherent  Speech Volume: Normal  Handedness:No data recorded  Mood and Affect  Mood: Euthymic  Affect: Congruent; Appropriate   Thought Process  Thought Processes: Coherent; Goal Directed; Linear  Descriptions of Associations:Intact  Orientation:Full (Time, Place and Person)  Thought Content:WDL     Hallucinations:Hallucinations: None (None  today)  Ideas of Reference:None  Suicidal Thoughts:Suicidal Thoughts: No  Homicidal Thoughts:Homicidal Thoughts: No   Sensorium  Memory: Immediate Good; Recent Good; Remote Good  Judgment: Fair  Insight: Fair   Chartered certified accountant: Fair  Attention Span: Fair  Recall: Fair  Fund of Knowledge: Fair  Language: Fair   Psychomotor Activity  Psychomotor Activity: Psychomotor Activity: Normal   Assets  Assets: Desire for Improvement; Physical Health; Resilience   Sleep  Sleep: Sleep: Good   No data recorded  Physical Exam  Physical Exam Vitals reviewed.  Constitutional:      General: She is not in acute distress.    Comments: Older than stated age  HENT:     Head: Normocephalic and atraumatic.  Eyes:     Extraocular Movements: Extraocular movements intact.     Pupils: Pupils are equal, round, and reactive to light.  Pulmonary:     Effort: Pulmonary effort is normal. No respiratory distress.  Abdominal:     General: Abdomen is flat.  Musculoskeletal:        General: Normal range of motion.  Neurological:     Mental Status: She is alert and oriented to person, place, and time. Mental status is at baseline.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

## 2023-01-11 NOTE — ED Notes (Signed)
Patient in milieu. Environment is secured. Will continue to monitor for safety. 

## 2023-01-11 NOTE — ED Notes (Signed)
Assumed care of patient this am, she presented A&Ox3, with a flat and sad affect, denied having notable withdrawal sx. She was somewhat anxious and r/t low back pain, 9/10 and she received Tylenol 650 mg. Pt also denied having SI/HI, A/V/H and gave a verbal commitment to be safety and will seek out staff if feeling overwhelmed with thoughts. Patient attended group this morning and was noted interacting with peers in the dining area and seemed appropriate. Will continue to monitor for safety, educate on medication regimen and plan of care and support as needed.

## 2023-01-11 NOTE — ED Notes (Signed)
Patient requesting pain medication for back

## 2023-01-11 NOTE — Group Note (Signed)
Group Topic: Recovery Basics  Group Date: 01/11/2023 Start Time: 0900 End Time: 1000 Facilitators: Candis Schatz, NT  Department: [facility]  Number of Participants: 7  Group Focus: daily focus Treatment Modality:  Spiritual Interventions utilized were support Purpose: increase insight  Name: Lauren Blackwell Date of Birth: 09-24-1965  MR: [id number]    Level of Participation: actively listened Quality of Participation: attentive Interactions with others: receptive Mood/Affect: positive Triggers (if applicable): none noted Cognition: insightful Progress: Gaining insight Response: open Plan: patient will be encouraged to continue 12 step support group  Patients Problems:  Patient Active Problem List   Diagnosis Date Noted   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/2023   Alcohol-induced mood disorder (HCC) 01/09/2023   Alcohol use disorder 01/09/2023   Other emphysema (HCC) 11/21/2022   Aortic atherosclerosis (HCC) 11/21/2022   Right lower lobe pulmonary nodule 11/21/2022   GAD (generalized anxiety disorder) 11/21/2022   Bipolar disorder, in full remission, most recent episode mixed (HCC) 11/21/2022   Tobacco abuse 11/21/2022   Alcohol abuse 11/21/2022   Cocaine abuse (HCC) 11/21/2022   Methamphetamine abuse (HCC) 11/21/2022   Acute hypoxemic respiratory failure (HCC) 07/08/2021   Methamphetamine use disorder, severe, dependence (HCC) 07/08/2021   Opioid overdose (HCC) 07/08/2021   Respiratory failure (HCC) 07/08/2021

## 2023-01-11 NOTE — Discharge Instructions (Addendum)
[facility] [address], [place], [address] [phone] phone   New Patient Assessment/Therapy Walk-Ins:  Monday and Wednesday: 8 am until slots are full. Every 1st and 2nd Fridays of the month: 1 pm - 5 pm.  NO ASSESSMENT/THERAPY WALK-INS ON TUESDAYS OR THURSDAYS  New Patient Assessment/Medication Management Walk-Ins:  Monday - Friday:  8 am - 11 am.  For all walk-ins, we ask that you arrive by 7:30 am because patients will be seen in the order of arrival.  Availability is limited; therefore, you may not be seen on the same day that you walk-in.  Our goal is to serve and meet the needs of our community to the best of our ability.   In case of an urgent crisis, you may contact the Mobile Crisis Unit with [facility] at [phone].  SUBSTANCE USE TREATMENT for Medicaid/Medicare and State Funded/IPRS  [facility] [address], [place], [address] [phone] phone NOTE: [facility] is no longer offering IOP services.  Serves those who are low-income or have no insurance.  [facility] [address] [place], [place], [address] [phone] phone [phone] fax NOTE: Does have Substance Abuse-Intensive Outpatient Program [facility]) as well as transitional housing if eligible.  [facility] [address] [place], [place], [address] [phone] phone [phone] fax  [facility] [phone] [place], [place], [address] [phone] phone [phone] fax   Substance Use Resources for follow up:   [facility] [address] [place] [address] [phone]  [facility] [employer] [phone]  [employer].oxfordvacancies.com  [facility] [facility] [employer]  Comptroller  [facility] [employer] [address], [place], [place]  [address] [phone]  [facility] [address], [place], [place] [address] [phone]  [facility] [address] [place], [place]  [address] [phone]  [facility] [address] [place], [place] [address] [phone]

## 2023-01-11 NOTE — Group Note (Signed)
Group Topic: Communication  Group Date: 01/11/2023 Start Time: 2130 End Time: 2200 Facilitators: Guss Bunde  Department: [facility]  Number of Participants:   Group Focus: Wrap Up Group Treatment Modality:   Interventions utilized were  Purpose: This group is focused on patient's question and concerns about standard care and goals relating to discharge planning and bedside manner.  Patients have a chance to share positive experiences and per support.  Name: Lauren Blackwell Date of Birth: Nov 06, 1965  MR: [id number]    Level of Participation: Pt did not attend. Quality of Participation:  Interactions with others:  Mood/Affect:  Triggers (if applicable):  Cognition:  Progress:  Response:  Plan:   Patients Problems:  Patient Active Problem List   Diagnosis Date Noted   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/2023   Alcohol-induced mood disorder (HCC) 01/09/2023   Alcohol use disorder 01/09/2023   Other emphysema (HCC) 11/21/2022   Aortic atherosclerosis (HCC) 11/21/2022   Right lower lobe pulmonary nodule 11/21/2022   GAD (generalized anxiety disorder) 11/21/2022   Bipolar disorder, in full remission, most recent episode mixed (HCC) 11/21/2022   Tobacco abuse 11/21/2022   Alcohol abuse 11/21/2022   Cocaine abuse (HCC) 11/21/2022   Methamphetamine abuse (HCC) 11/21/2022   Acute hypoxemic respiratory failure (HCC) 07/08/2021   Methamphetamine use disorder, severe, dependence (HCC) 07/08/2021   Opioid overdose (HCC) 07/08/2021   Respiratory failure (HCC) 07/08/2021

## 2023-01-11 NOTE — ED Notes (Signed)
Patient observed/assessed at bedside lying in bed asleep. Patient denies pain and anxiety. He denies A/V/H. He denies having any thoughts/plan of self harm and harm towards others.  Patient states that appetite has been good throughout the day. Verbalizes no further complaints at this time. Will continue to monitor and support.

## 2023-01-11 NOTE — ED Notes (Signed)
Pt. lying quietly in bed, resting, c/o being "very sleepy". Pt denied having any problems or concerns.

## 2023-01-11 NOTE — Care Management (Addendum)
Care Management   Per Chanetta Marshall at [facility], the patient has been accepted to [facility] on January 12, 2023 at 9am. With her medication.   Writer informed the MD working with the patient.   Writer met with the patient and informed her that she will be discharging to [facility]  Patient is in agreement with the discharge plan.

## 2023-01-11 NOTE — Group Note (Signed)
Group Topic: Wellness  Group Date: 01/11/2023 Start Time: 1140 End Time: 1220 Facilitators: Londell Moh, NT  Department: [facility]  Number of Participants: 10  Group Focus: other Nutrition Treatment Modality:  Psychoeducation Interventions utilized were patient education Purpose: increase insight  Name: Lauren Blackwell Date of Birth: 10/28/1965  MR: [id number]    Level of Participation: active Quality of Participation: attentive Interactions with others: gave feedback Mood/Affect: appropriate Triggers (if applicable): n/a Cognition: coherent/clear Progress: Gaining insight Response: Pt was active during group. Dietitian came in and spoke to group about healthy eating and good habits. Plan: patient will be encouraged to continue to attend groups  Patients Problems:  Patient Active Problem List   Diagnosis Date Noted   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/2023   Alcohol-induced mood disorder (HCC) 01/09/2023   Alcohol use disorder 01/09/2023   Other emphysema (HCC) 11/21/2022   Aortic atherosclerosis (HCC) 11/21/2022   Right lower lobe pulmonary nodule 11/21/2022   GAD (generalized anxiety disorder) 11/21/2022   Bipolar disorder, in full remission, most recent episode mixed (HCC) 11/21/2022   Tobacco abuse 11/21/2022   Alcohol abuse 11/21/2022   Cocaine abuse (HCC) 11/21/2022   Methamphetamine abuse (HCC) 11/21/2022   Acute hypoxemic respiratory failure (HCC) 07/08/2021   Methamphetamine use disorder, severe, dependence (HCC) 07/08/2021   Opioid overdose (HCC) 07/08/2021   Respiratory failure (HCC) 07/08/2021

## 2023-01-11 NOTE — ED Notes (Signed)
Patient in room. Environment is secured. Will continue to monitor for safety. 

## 2023-01-11 NOTE — Group Note (Signed)
Group Topic: Recovery Basics  Group Date: 01/11/2023 Start Time: 2000 End Time: 2100 Facilitators: Guss Bunde  Department: [facility]  Number of Participants:   Group Focus:  Treatment Modality:   Interventions utilized were  Purpose:   Name: Lauren Blackwell Date of Birth: 11-Feb-1966  MR: [id number]    Level of Participation: Pt did not attend Quality of Participation:  Interactions with others:  Mood/Affect:  Triggers (if applicable):  Cognition:  Progress:  Response:  Plan:   Patients Problems:  Patient Active Problem List   Diagnosis Date Noted   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/2023   Alcohol-induced mood disorder (HCC) 01/09/2023   Alcohol use disorder 01/09/2023   Other emphysema (HCC) 11/21/2022   Aortic atherosclerosis (HCC) 11/21/2022   Right lower lobe pulmonary nodule 11/21/2022   GAD (generalized anxiety disorder) 11/21/2022   Bipolar disorder, in full remission, most recent episode mixed (HCC) 11/21/2022   Tobacco abuse 11/21/2022   Alcohol abuse 11/21/2022   Cocaine abuse (HCC) 11/21/2022   Methamphetamine abuse (HCC) 11/21/2022   Acute hypoxemic respiratory failure (HCC) 07/08/2021   Methamphetamine use disorder, severe, dependence (HCC) 07/08/2021   Opioid overdose (HCC) 07/08/2021   Respiratory failure (HCC) 07/08/2021

## 2023-01-11 NOTE — ED Notes (Signed)
Patient is sleeping. Respirations equal and unlabored, skin warm and dry. No change in assessment or acuity. Routine safety checks conducted according to facility protocol. Will continue to monitor for safety.   

## 2023-01-11 NOTE — ED Notes (Signed)
Patient resting quietly in bed, denied having any symptoms r/t ETOH withdrawals, stated she's feeling "pretty good" and without cravings. Will continue to monitor for safety and educate on plan of care.

## 2023-01-11 NOTE — ED Notes (Addendum)
n

## 2023-01-12 NOTE — ED Notes (Signed)
Patient in milieu. Environment is secured. Will continue to monitor for safety. 
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
## 2023-01-12 NOTE — ED Notes (Signed)
Patient is sleeping. Respirations equal and unlabored, skin warm and dry. No change in assessment or acuity. Routine safety checks conducted according to facility protocol. Will continue to monitor for safety.   

## 2023-01-12 NOTE — ED Notes (Signed)
Patient A&O x 4, ambulatory. Patient discharged in no acute distress. Patient denied SI/HI, A/VH upon discharge. Patient verbalized understanding of all discharge instructions explained by staff, to include follow up appointments, RX's and safety plan. Patient reported mood 10/10.  Pt belongings returned to patient from locker #  3 intact. Patient escorted to lobby via staff for transport to destination. Safety maintained.

## 2023-01-12 NOTE — ED Notes (Signed)
Patient alert and oriented x 3. Denies SI/HI/AVH. Denies intent or plan to harm self or others. Routine conducted according to faculty protocol. Encourage patient to notify staff with any needs or concerns. Patient verbalized agreement and understanding. Will continue to monitor for safety. 

## 2023-01-12 NOTE — ED Notes (Signed)
Patient left going to [facility]

## 2023-01-22 ENCOUNTER — Ambulatory Visit: Payer: MEDICAID | Admitting: Physician Assistant

## 2023-01-22 VITALS — BP 110/68 | HR 80 | Wt 125.0 lb

## 2023-01-22 DIAGNOSIS — F17210 Nicotine dependence, cigarettes, uncomplicated: Secondary | ICD-10-CM | POA: Diagnosis not present

## 2023-01-22 DIAGNOSIS — Z124 Encounter for screening for malignant neoplasm of cervix: Secondary | ICD-10-CM

## 2023-01-22 DIAGNOSIS — N760 Acute vaginitis: Secondary | ICD-10-CM

## 2023-01-22 DIAGNOSIS — E559 Vitamin D deficiency, unspecified: Secondary | ICD-10-CM

## 2023-01-22 DIAGNOSIS — J438 Other emphysema: Secondary | ICD-10-CM

## 2023-01-22 DIAGNOSIS — F1010 Alcohol abuse, uncomplicated: Secondary | ICD-10-CM

## 2023-01-22 DIAGNOSIS — F1410 Cocaine abuse, uncomplicated: Secondary | ICD-10-CM

## 2023-01-22 DIAGNOSIS — B9689 Other specified bacterial agents as the cause of diseases classified elsewhere: Secondary | ICD-10-CM

## 2023-01-22 DIAGNOSIS — Z5901 Sheltered homelessness: Secondary | ICD-10-CM

## 2023-01-22 MED ORDER — BUDESONIDE-FORMOTEROL FUMARATE 160-4.5 MCG/ACT IN AERO
2.0000 | INHALATION_SPRAY | Freq: Two times a day (BID) | RESPIRATORY_TRACT | 1 refills | Status: DC
Start: 2023-01-22 — End: 2023-11-25

## 2023-01-22 MED ORDER — METRONIDAZOLE 500 MG PO TABS
500.0000 mg | ORAL_TABLET | Freq: Two times a day (BID) | ORAL | 0 refills | Status: AC
Start: 2023-01-22 — End: 2023-01-29

## 2023-01-22 MED ORDER — VITAMIN D (ERGOCALCIFEROL) 1.25 MG (50000 UNIT) PO CAPS
50000.0000 [IU] | ORAL_CAPSULE | ORAL | 2 refills | Status: DC
Start: 2023-01-22 — End: 2023-11-20

## 2023-01-22 MED ORDER — ALBUTEROL SULFATE HFA 108 (90 BASE) MCG/ACT IN AERS: 1.0000 | INHALATION_SPRAY | Freq: Four times a day (QID) | RESPIRATORY_TRACT | 1 refills | Status: DC | PRN

## 2023-01-22 NOTE — Patient Instructions (Signed)
You are going to take Flagyl twice a day for the next 7 days.  You are going to take vitamin D 50,000 units once a week for the next 12 weeks.  You should have your vitamin D levels rechecked after you complete the 12-week treatment.    I started a referral for gynecology, they will reach out to you at [facility] to schedule an appointment.  Roney Jaffe, PA-C Physician Assistant [facility] [email or web address]  Bacterial Vaginosis  Bacterial vaginosis is an infection that occurs when the normal balance of bacteria in the vagina changes. This change is caused by an overgrowth of certain bacteria in the vagina. Bacterial vaginosis is the most common vaginal infection among females aged 31 to 57 years. This condition increases the risk of sexually transmitted infections (STIs). Treatment can help reduce this risk. Treatment is very important for pregnant women because this condition can cause babies to be born early (prematurely) or at a low birth weight. What are the causes? This condition is caused by an increase in harmful bacteria that are normally present in small amounts in the vagina. However, the exact reason this condition develops is not known. You cannot get bacterial vaginosis from toilet seats, bedding, swimming pools, or contact with objects around you. What increases the risk? The following factors may make you more likely to develop this condition: Having a new sexual partner or multiple sexual partners, or having unprotected sex. Douching. Having an intrauterine device (IUD). Smoking. Abusing drugs and alcohol. This may lead to riskier sexual behavior. Taking certain antibiotic medicines. Being pregnant. What are the signs or symptoms? Some women with this condition have no symptoms. Symptoms may include: Wallace Cullens or white vaginal discharge. The discharge can be watery or foamy. A fish-like odor with discharge, especially  after sex or during menstruation. Itching in and around the vagina. Burning or pain with urination. How is this diagnosed? This condition is diagnosed based on: Your medical history. A physical exam of the vagina. Checking a sample of vaginal fluid for harmful bacteria or abnormal cells. How is this treated? This condition is treated with antibiotic medicines. These may be given as a pill, a vaginal cream, or a medicine that is put into the vagina (suppository). If the condition comes back after treatment, a second round of antibiotics may be needed. Follow these instructions at home: Medicines Take or apply over-the-counter and prescription medicines only as told by your health care provider. Take or apply your antibiotic medicine as told by your health care provider. Do not stop using the antibiotic even if you start to feel better. General instructions If you have a female sexual partner, tell her that you have a vaginal infection. She should follow up with her health care provider. If you have a female sexual partner, he does not need treatment. Avoid sexual activity until you finish treatment. Drink enough fluid to keep your urine pale yellow. Keep the area around your vagina and rectum clean. Wash the area daily with warm water. Wipe yourself from front to back after using the toilet. If you are breastfeeding, talk to your health care provider about continuing breastfeeding during treatment. Keep all follow-up visits. This is important. How is this prevented? Self-care Do not douche. Wash the outside of your vagina with warm water only. Wear cotton or cotton-lined underwear. Avoid wearing tight pants and pantyhose, especially during the summer. Safe sex Use protection when having sex. This includes: Using condoms. Using dental dams. This is  a thin layer of a material made of latex or polyurethane that protects the mouth during oral sex. Limit the number of sexual partners. To  help prevent bacterial vaginosis, it is best to have sex with just one partner (monogamous relationship). Make sure you and your sexual partner are tested for STIs. Drugs and alcohol Do not use any products that contain nicotine or tobacco. These products include cigarettes, chewing tobacco, and vaping devices, such as e-cigarettes. If you need help quitting, ask your health care provider. Do not use drugs. Do not drink alcohol if: Your health care provider tells you not to do this. You are pregnant, may be pregnant, or are planning to become pregnant. If you drink alcohol: Limit how much you have to 0-1 drink a day. Be aware of how much alcohol is in your drink. In the [place] one drink equals one 12 oz bottle of beer (355 mL), one 5 oz glass of wine (148 mL), or one 1 oz glass of hard liquor (44 mL). Where to find more information Centers for Disease Control and Prevention: FootballExhibition.com.br American Sexual Health Association (ASHA): www.ashastd.org U.S. Department of Health and Health and safety inspector, Office on Women's Health: http://hoffman.com/ Contact a health care provider if: Your symptoms do not improve, even after treatment. You have more discharge or pain when urinating. You have a fever or chills. You have pain in your abdomen or pelvis. You have pain during sex. You have vaginal bleeding between menstrual periods. Summary Bacterial vaginosis is a vaginal infection that occurs when the normal balance of bacteria in the vagina changes. It results from an overgrowth of certain bacteria. This condition increases the risk of sexually transmitted infections (STIs). Getting treated can help reduce this risk. Treatment is very important for pregnant women because this condition can cause babies to be born early (prematurely) or at low birth weight. This condition is treated with antibiotic medicines. These may be given as a pill, a vaginal cream, or a medicine that is put into the vagina  (suppository). This information is not intended to replace advice given to you by your health care provider. Make sure you discuss any questions you have with your health care provider. Document Revised: 12/04/2019 Document Reviewed: 12/04/2019 Elsevier Patient Education  2024 [employer].
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
## 2023-01-24 ENCOUNTER — Encounter: Payer: Self-pay | Admitting: Physician Assistant

## 2023-02-05 ENCOUNTER — Telehealth: Payer: MEDICAID | Admitting: Physician Assistant

## 2023-02-05 DIAGNOSIS — J069 Acute upper respiratory infection, unspecified: Secondary | ICD-10-CM

## 2023-02-05 DIAGNOSIS — R051 Acute cough: Secondary | ICD-10-CM

## 2023-02-05 MED ORDER — BENZONATATE 100 MG PO CAPS
ORAL_CAPSULE | ORAL | 0 refills | Status: DC
Start: 2023-02-05 — End: 2023-11-20

## 2023-02-05 NOTE — Patient Instructions (Signed)
To help with your cough you are going to use Tessalon Perles 100 mg capsules, you will take 1-2 of these every 8 hours as needed.  I encourage you to use over-the-counter cold medication to help with the rest of your symptoms.  Make sure that you are staying well-hydrated and get plenty of rest.  Roney Jaffe, PA-C Physician Assistant [facility] [email or web address]   Upper Respiratory Infection, Adult An upper respiratory infection (URI) is a common viral infection of the nose, throat, and upper air passages that lead to the lungs. The most common type of URI is the common cold. URIs usually get better on their own, without medical treatment. What are the causes? A URI is caused by a virus. You may catch a virus by: Breathing in droplets from an infected person's cough or sneeze. Touching something that has been exposed to the virus (is contaminated) and then touching your mouth, nose, or eyes. What increases the risk? You are more likely to get a URI if: You are very young or very old. You have close contact with others, such as at work, school, or a health care facility. You smoke. You have long-term (chronic) heart or lung disease. You have a weakened disease-fighting system (immune system). You have nasal allergies or asthma. You are experiencing a lot of stress. You have poor nutrition. What are the signs or symptoms? A URI usually involves some of the following symptoms: Runny or stuffy (congested) nose. Cough. Sneezing. Sore throat. Headache. Fatigue. Fever. Loss of appetite. Pain in your forehead, behind your eyes, and over your cheekbones (sinus pain). Muscle aches. Redness or irritation of the eyes. Pressure in the ears or face. How is this diagnosed? This condition may be diagnosed based on your medical history and symptoms, and a physical exam. Your health care provider may use a swab to take a mucus sample  from your nose (nasal swab). This sample can be tested to determine what virus is causing the illness. How is this treated? URIs usually get better on their own within 7-10 days. Medicines cannot cure URIs, but your health care provider may recommend certain medicines to help relieve symptoms, such as: Over-the-counter cold medicines. Cough suppressants. Coughing is a type of defense against infection that helps to clear the respiratory system, so take these medicines only as recommended by your health care provider. Fever-reducing medicines. Follow these instructions at home: Activity Rest as needed. If you have a fever, stay home from work or school until your fever is gone or until your health care provider says your URI cannot spread to other people (is no longer contagious). Your health care provider may have you wear a face mask to prevent your infection from spreading. Relieving symptoms Gargle with a mixture of salt and water 3-4 times a day or as needed. To make salt water, completely dissolve -1 tsp (3-6 g) of salt in 1 cup (237 mL) of warm water. Use a cool-mist humidifier to add moisture to the air. This can help you breathe more easily. Eating and drinking  Drink enough fluid to keep your urine pale yellow. Eat soups and other clear broths. General instructions  Take over-the-counter and prescription medicines only as told by your health care provider. These include cold medicines, fever reducers, and cough suppressants. Do not use any products that contain nicotine or tobacco. These products include cigarettes, chewing tobacco, and vaping devices, such as e-cigarettes. If you need help quitting, ask your health care provider.  Stay away from secondhand smoke. Stay up to date on all immunizations, including the yearly (annual) flu vaccine. Keep all follow-up visits. This is important. How to prevent the spread of infection to others URIs can be contagious. To prevent the  infection from spreading: Wash your hands with soap and water for at least 20 seconds. If soap and water are not available, use hand sanitizer. Avoid touching your mouth, face, eyes, or nose. Cough or sneeze into a tissue or your sleeve or elbow instead of into your hand or into the air.  Contact a health care provider if: You are getting worse instead of better. You have a fever or chills. Your mucus is brown or red. You have yellow or brown discharge coming from your nose. You have pain in your face, especially when you bend forward. You have swollen neck glands. You have pain while swallowing. You have white areas in the back of your throat. Get help right away if: You have shortness of breath that gets worse. You have severe or persistent: Headache. Ear pain. Sinus pain. Chest pain. You have chronic lung disease along with any of the following: Making high-pitched whistling sounds when you breathe, most often when you breathe out (wheezing). Prolonged cough (more than 14 days). Coughing up blood. A change in your usual mucus. You have a stiff neck. You have changes in your: Vision. Hearing. Thinking. Mood. These symptoms may be an emergency. Get help right away. Call 911. Do not wait to see if the symptoms will go away. Do not drive yourself to the hospital. Summary An upper respiratory infection (URI) is a common infection of the nose, throat, and upper air passages that lead to the lungs. A URI is caused by a virus. URIs usually get better on their own within 7-10 days. Medicines cannot cure URIs, but your health care provider may recommend certain medicines to help relieve symptoms. This information is not intended to replace advice given to you by your health care provider. Make sure you discuss any questions you have with your health care provider. Document Revised: 01/05/2021 Document Reviewed: 01/05/2021 Elsevier Patient Education  2024 [employer].

## 2023-02-05 NOTE — Progress Notes (Unsigned)
   Established Patient Office Visit  Subjective   Patient ID: Lauren Blackwell, female    DOB: [birth year]  Age: 57 y.o. MRN: [id number]  No chief complaint on file.   States that she continues to be treated for substance abuse at [facility] residential treatment center.  Sore throat congestion achy all over  Yesterday - chills Covid was negative  Cough - dry [place] for the past two days   Low appetite - staying hydrated   Tylenol - does help some       {History (Optional):23778}  ROS    Objective:     There were no vitals taken for this visit. {Vitals History (Optional):23777}  Physical Exam   No results found for any visits on 02/05/23.  {Labs (Optional):23779}  The 10-year ASCVD risk score (Arnett DK, et al., 2019) is: 2.7%    Assessment & Plan:   Problem List Items Addressed This Visit   None   No follow-ups on file.    Kasandra Knudsen Mayers, PA-C

## 2023-02-06 ENCOUNTER — Encounter: Payer: Self-pay | Admitting: Physician Assistant

## 2023-03-14 ENCOUNTER — Telehealth: Payer: Self-pay

## 2023-03-14 NOTE — Telephone Encounter (Signed)
Called patient to schedule new patient appointment. Left voicemail with our contact information to call back and schedule.  

## 2023-11-19 ENCOUNTER — Other Ambulatory Visit (HOSPITAL_COMMUNITY)
Admission: EM | Admit: 2023-11-19 | Discharge: 2023-11-26 | Payer: MEDICAID | Attending: Psychiatry | Admitting: Psychiatry

## 2023-11-19 ENCOUNTER — Ambulatory Visit (HOSPITAL_COMMUNITY)
Admission: EM | Admit: 2023-11-19 | Discharge: 2023-11-19 | Disposition: A | Discharge status: Nursing Home (Includes ICF) | Payer: MEDICAID | Attending: Psychiatry | Admitting: Psychiatry

## 2023-11-19 DIAGNOSIS — F1020 Alcohol dependence, uncomplicated: Secondary | ICD-10-CM | POA: Diagnosis not present

## 2023-11-19 DIAGNOSIS — F1421 Cocaine dependence, in remission: Secondary | ICD-10-CM | POA: Insufficient documentation

## 2023-11-19 DIAGNOSIS — F1094 Alcohol use, unspecified with alcohol-induced mood disorder: Secondary | ICD-10-CM

## 2023-11-19 DIAGNOSIS — F411 Generalized anxiety disorder: Secondary | ICD-10-CM | POA: Diagnosis not present

## 2023-11-19 DIAGNOSIS — F322 Major depressive disorder, single episode, severe without psychotic features: Secondary | ICD-10-CM | POA: Diagnosis not present

## 2023-11-19 DIAGNOSIS — F1410 Cocaine abuse, uncomplicated: Secondary | ICD-10-CM | POA: Diagnosis not present

## 2023-11-19 DIAGNOSIS — F17200 Nicotine dependence, unspecified, uncomplicated: Secondary | ICD-10-CM

## 2023-11-19 DIAGNOSIS — J449 Chronic obstructive pulmonary disease, unspecified: Secondary | ICD-10-CM | POA: Insufficient documentation

## 2023-11-19 DIAGNOSIS — F4310 Post-traumatic stress disorder, unspecified: Secondary | ICD-10-CM | POA: Insufficient documentation

## 2023-11-19 DIAGNOSIS — J438 Other emphysema: Secondary | ICD-10-CM

## 2023-11-19 DIAGNOSIS — F3181 Bipolar II disorder: Secondary | ICD-10-CM

## 2023-11-19 DIAGNOSIS — Z720 Tobacco use: Secondary | ICD-10-CM

## 2023-11-19 DIAGNOSIS — F1420 Cocaine dependence, uncomplicated: Secondary | ICD-10-CM | POA: Diagnosis present

## 2023-11-19 DIAGNOSIS — E7849 Other hyperlipidemia: Secondary | ICD-10-CM | POA: Insufficient documentation

## 2023-11-19 LAB — COMPREHENSIVE METABOLIC PANEL WITH GFR
ALT: 16 U/L (ref 0–44)
AST: 20 U/L (ref 15–41)
Albumin: 3.1 g/dL — ABNORMAL LOW (ref 3.5–5.0)
Alkaline Phosphatase: 90 U/L (ref 38–126)
Anion gap: 11 (ref 5–15)
BUN: 7 mg/dL (ref 6–20)
CO2: 28 mmol/L (ref 22–32)
Calcium: 9 mg/dL (ref 8.9–10.3)
Chloride: 101 mmol/L (ref 98–111)
Creatinine, Ser: 0.73 mg/dL (ref 0.44–1.00)
GFR, Estimated: >60 mL/min (ref >60)
Glucose, Bld: 122 mg/dL — ABNORMAL HIGH (ref 70–99)
Potassium: 3.9 mmol/L (ref 3.5–5.1)
Sodium: 140 mmol/L (ref 135–145)
Total Bilirubin: 0.4 mg/dL (ref 0.0–1.2)
Total Protein: 5.8 g/dL — ABNORMAL LOW (ref 6.5–8.1)

## 2023-11-19 LAB — CBC WITH DIFFERENTIAL/PLATELET
Abs Immature Granulocytes: 0.01 10*3/uL (ref 0.00–0.07)
Basophils Absolute: 0 10*3/uL (ref 0.0–0.1)
Basophils Relative: 1 %
Eosinophils Absolute: 0.1 10*3/uL (ref 0.0–0.5)
Eosinophils Relative: 2 %
HCT: 44.4 % (ref 36.0–46.0)
Hemoglobin: 15.4 g/dL — ABNORMAL HIGH (ref 12.0–15.0)
Immature Granulocytes: 0 %
Lymphocytes Relative: 41 %
Lymphs Abs: 2.6 10*3/uL (ref 0.7–4.0)
MCH: 31.3 pg (ref 26.0–34.0)
MCHC: 34.7 g/dL (ref 30.0–36.0)
MCV: 90.2 fL (ref 80.0–100.0)
Monocytes Absolute: 0.5 10*3/uL (ref 0.1–1.0)
Monocytes Relative: 9 %
Neutro Abs: 3 10*3/uL (ref 1.7–7.7)
Neutrophils Relative %: 47 %
Platelets: 338 10*3/uL (ref 150–400)
RBC: 4.92 MIL/uL (ref 3.87–5.11)
RDW: 13.4 % (ref 11.5–15.5)
WBC: 6.3 10*3/uL (ref 4.0–10.5)
nRBC: 0 % (ref 0.0–0.2)

## 2023-11-19 LAB — POCT URINE DRUG SCREEN - MANUAL ENTRY (I-SCREEN)
POC Amphetamine UR: NOT DETECTED
POC Buprenorphine (BUP): NOT DETECTED
POC Cocaine UR: POSITIVE — AB
POC Marijuana UR: NOT DETECTED
POC Methadone UR: NOT DETECTED
POC Methamphetamine UR: NOT DETECTED
POC Morphine: NOT DETECTED
POC Oxazepam (BZO): NOT DETECTED
POC Oxycodone UR: NOT DETECTED
POC Secobarbital (BAR): NOT DETECTED

## 2023-11-19 LAB — LIPID PANEL
Cholesterol: 163 mg/dL (ref 0–200)
HDL: 51 mg/dL (ref >40)
LDL Cholesterol: 99 mg/dL (ref 0–99)
Total CHOL/HDL Ratio: 3.2 ratio
Triglycerides: 65 mg/dL (ref <150)
VLDL: 13 mg/dL (ref 0–40)

## 2023-11-19 LAB — TSH: TSH: 1.767 u[IU]/mL (ref 0.350–4.500)

## 2023-11-19 LAB — HEMOGLOBIN A1C
Hgb A1c MFr Bld: 5.3 % (ref 4.8–5.6)
Mean Plasma Glucose: 105.41 mg/dL

## 2023-11-19 LAB — ETHANOL: Alcohol, Ethyl (B): <15 mg/dL (ref <15)

## 2023-11-19 MED ORDER — LORAZEPAM 2 MG/ML IJ SOLN: 2.0000 mg | Freq: Three times a day (TID) | INTRAMUSCULAR | Status: DC | PRN

## 2023-11-19 MED ORDER — FLUTICASONE FUROATE-VILANTEROL 200-25 MCG/ACT IN AEPB: 1.0000 | INHALATION_SPRAY | Freq: Every day | RESPIRATORY_TRACT | Status: DC

## 2023-11-19 MED ORDER — LORAZEPAM 1 MG PO TABS: 1.0000 mg | ORAL_TABLET | Freq: Four times a day (QID) | ORAL | Status: DC | PRN

## 2023-11-19 MED ORDER — LORAZEPAM 1 MG PO TABS: 1.0000 mg | ORAL_TABLET | Freq: Three times a day (TID) | ORAL | Status: DC

## 2023-11-19 MED ORDER — ALUM & MAG HYDROXIDE-SIMETH 200-200-20 MG/5ML PO SUSP: 30.0000 mL | ORAL | Status: DC | PRN

## 2023-11-19 MED ORDER — NICOTINE 21 MG/24HR TD PT24: 21.0000 mg | MEDICATED_PATCH | Freq: Every day | TRANSDERMAL | Status: DC

## 2023-11-19 MED ORDER — THIAMINE HCL 100 MG/ML IJ SOLN
100.0000 mg | Freq: Once | INTRAMUSCULAR | Status: AC
  Administered 2023-11-19: 100 mg via INTRAMUSCULAR
  Filled 2023-11-19: qty 2

## 2023-11-19 MED ORDER — HALOPERIDOL 5 MG PO TABS: 5.0000 mg | ORAL_TABLET | Freq: Three times a day (TID) | ORAL | Status: DC | PRN

## 2023-11-19 MED ORDER — ACETAMINOPHEN 325 MG PO TABS
650.0000 mg | ORAL_TABLET | Freq: Four times a day (QID) | ORAL | Status: DC | PRN
  Administered 2023-11-19: 650 mg via ORAL
  Filled 2023-11-19: qty 2

## 2023-11-19 MED ORDER — HALOPERIDOL LACTATE 5 MG/ML IJ SOLN: 5.0000 mg | Freq: Three times a day (TID) | INTRAMUSCULAR | Status: DC | PRN

## 2023-11-19 MED ORDER — ADULT MULTIVITAMIN W/MINERALS CH
1.0000 | ORAL_TABLET | Freq: Every day | ORAL | Status: DC
  Administered 2023-11-19: 1 via ORAL
  Filled 2023-11-19: qty 1

## 2023-11-19 MED ORDER — LORAZEPAM 1 MG PO TABS: 1.0000 mg | ORAL_TABLET | Freq: Two times a day (BID) | ORAL | Status: DC

## 2023-11-19 MED ORDER — BUDESON-GLYCOPYRROL-FORMOTEROL 160-9-4.8 MCG/ACT IN AERO: 2.0000 | INHALATION_SPRAY | Freq: Two times a day (BID) | RESPIRATORY_TRACT | Status: DC

## 2023-11-19 MED ORDER — ONDANSETRON 4 MG PO TBDP: 4.0000 mg | ORAL_TABLET | Freq: Four times a day (QID) | ORAL | Status: DC | PRN

## 2023-11-19 MED ORDER — GABAPENTIN 300 MG PO CAPS: 300.0000 mg | ORAL_CAPSULE | Freq: Two times a day (BID) | ORAL | Status: DC

## 2023-11-19 MED ORDER — LORAZEPAM 1 MG PO TABS
1.0000 mg | ORAL_TABLET | Freq: Four times a day (QID) | ORAL | Status: DC
  Administered 2023-11-19 (×2): 1 mg via ORAL
  Filled 2023-11-19 (×2): qty 1

## 2023-11-19 MED ORDER — TRAZODONE HCL 50 MG PO TABS: 50.0000 mg | ORAL_TABLET | Freq: Every evening | ORAL | Status: DC | PRN

## 2023-11-19 MED ORDER — DIPHENHYDRAMINE HCL 50 MG/ML IJ SOLN: 50.0000 mg | Freq: Three times a day (TID) | INTRAMUSCULAR | Status: DC | PRN

## 2023-11-19 MED ORDER — ATORVASTATIN CALCIUM 10 MG PO TABS: 20.0000 mg | ORAL_TABLET | Freq: Every day | ORAL | Status: DC

## 2023-11-19 MED ORDER — DULOXETINE HCL 30 MG PO CPEP
30.0000 mg | ORAL_CAPSULE | Freq: Two times a day (BID) | ORAL | Status: DC
  Administered 2023-11-19: 30 mg via ORAL
  Filled 2023-11-19: qty 1

## 2023-11-19 MED ORDER — DIPHENHYDRAMINE HCL 50 MG PO CAPS: 50.0000 mg | ORAL_CAPSULE | Freq: Three times a day (TID) | ORAL | Status: DC | PRN

## 2023-11-19 MED ORDER — HYDROXYZINE HCL 25 MG PO TABS: 25.0000 mg | ORAL_TABLET | Freq: Four times a day (QID) | ORAL | Status: DC | PRN

## 2023-11-19 MED ORDER — LORAZEPAM 1 MG PO TABS: 1.0000 mg | ORAL_TABLET | Freq: Every day | ORAL | Status: DC

## 2023-11-19 MED ORDER — LOPERAMIDE HCL 2 MG PO CAPS: 2.0000 mg | ORAL_CAPSULE | ORAL | Status: DC | PRN

## 2023-11-19 MED ORDER — MAGNESIUM HYDROXIDE 400 MG/5ML PO SUSP: 30.0000 mL | Freq: Every day | ORAL | Status: DC | PRN

## 2023-11-19 MED ORDER — OLANZAPINE 10 MG PO TBDP
10.0000 mg | ORAL_TABLET | Freq: Every day | ORAL | Status: DC
  Administered 2023-11-19: 10 mg via ORAL
  Filled 2023-11-19: qty 1

## 2023-11-19 MED ORDER — THIAMINE MONONITRATE 100 MG PO TABS: 100.0000 mg | ORAL_TABLET | Freq: Every day | ORAL | Status: DC

## 2023-11-19 MED ORDER — HALOPERIDOL LACTATE 5 MG/ML IJ SOLN: 10.0000 mg | Freq: Three times a day (TID) | INTRAMUSCULAR | Status: DC | PRN

## 2023-11-19 NOTE — Progress Notes (Signed)
 Pt will transfer to [facility] pending labs.
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
## 2023-11-19 NOTE — ED Provider Notes (Signed)
 Behavioral Health Urgent Care Medical Screening Exam  Patient Name: Lauren Blackwell MRN: [id number] Date of Evaluation: 11/19/23 Chief Complaint: "Need to detox from Alcohol"  Diagnosis:  Final diagnoses:  Alcohol use disorder, severe, dependence (HCC)  Cocaine use disorder (HCC)  Tobacco use disorder  Bipolar 2 disorder (HCC)    History of Present illness:  Lauren Blackwell 58 y.o., female patient presented to [facility] as a voluntary walk in unaccompanied seeking ETOH detox. Pt reports she left caring services about 3 weeks ago where she was receiving treatment for about 10 months. She reports hx of daily ETOH use which started when she was about 58 years old, last ETOH use was ~ 0400 this morning, and she typically drinks about 4-5 40oz cans of beer daily. She is currently reporting withdrawal symptoms of hot and cold sweats. She also reports using smoking/crack cocaine/$100 daily, first use at age 7 and last use this morning.  She reports mental health history of bipolar, depression and anxiety. She denies SI/HI at this time. She reports poor sleep except when she is drinking. She reports her appetite is "not as good". She receives psychiatry services with Asa Lauth at [facility]  and [facility] Pt plans to go to [facility] for their year program after medical detox.   Merril Abelson, is seen face to face by this provider, consulted with Dr. Sherwood Donath; and chart reviewed on 11/19/23.  During evaluation Lauren Blackwell is sitting in the assessment room in no acute distress.  She is alert & oriented x 4, calm, cooperative and attentive for this assessment. Her mood is euthymic with congruent affect.  She has normal speech, and behavior.  Objectively there is no evidence of psychosis/mania or delusional thinking. Pt does not appear to be responding to internal or external stimuli.  Patient is able to converse coherently, goal directed thoughts, no distractibility, or  pre-occupation.  She also denies suicidal/self-harm/homicidal ideation, psychosis, and paranoia.  Patient answered question appropriately.     Flowsheet Row ED from 11/19/2023 in [facility] Most recent reading at 11/19/2023  5:23 PM ED from 01/09/2023 in [facility] Most recent reading at 01/09/2023  5:00 PM ED from 01/09/2023 in [facility] Emergency Department at [facility] Most recent reading at 01/09/2023  6:00 AM  C-SSRS RISK CATEGORY No Risk No Risk Moderate Risk       Psychiatric Specialty Exam  Presentation  General Appearance:Appropriate for Environment; Casual  Eye Contact:Good  Speech:Normal Rate  Speech Volume:Normal  Handedness:Not assessed  Mood and Affect  Mood: Euthymic  Affect: Appropriate; Congruent   Thought Process  Thought Processes: Coherent  Descriptions of Associations:Intact  Orientation:Full (Time, Place and Person)  Thought Content:Logical    Hallucinations:None  Ideas of Reference:None  Suicidal Thoughts:No Without Intent; Without Plan  Homicidal Thoughts:No   Sensorium  Memory: Recent Good; Remote Good  Judgment: Fair  Insight: Fair   Art therapist  Concentration: Good  Attention Span: Fair  Recall: Wetzel Hammock of Knowledge: Fair  Language: Fair   Psychomotor Activity  Psychomotor Activity: Normal   Assets  Assets: Manufacturing systems engineer; Desire for Improvement; Social Support   Sleep  Sleep: Fair  Number of hours: Pt unable to quantify.  Physical Exam: Physical Exam Constitutional:      Appearance: Normal appearance.  HENT:     Head: Normocephalic and atraumatic.     Right Ear: Tympanic membrane normal.     Left  Ear: Tympanic membrane normal.     Nose: Nose normal.     Mouth/Throat:     Mouth: Mucous membranes are dry.  Eyes:     Pupils: Pupils are equal, round, and reactive to light.  Cardiovascular:     Rate and  Rhythm: Normal rate and regular rhythm.  Pulmonary:     Effort: Pulmonary effort is normal.     Breath sounds: Normal breath sounds.  Abdominal:     General: Abdomen is flat.  Musculoskeletal:        General: Normal range of motion.     Cervical back: Normal range of motion.  Skin:    General: Skin is warm and dry.  Neurological:     Mental Status: She is alert and oriented to person, place, and time.  Psychiatric:        Mood and Affect: Mood normal.        Behavior: Behavior normal.        Thought Content: Thought content normal.        Judgment: Judgment normal.    Review of Systems  All other systems reviewed and are negative.  Blood pressure 116/76, pulse 88, temperature 98.6 F (37 C), temperature source Oral, resp. rate 20, SpO2 99%. There is no height or weight on file to calculate BMI.  Musculoskeletal: Strength & Muscle Tone: within normal limits Gait & Station: normal Patient leans: N/A   [facility] MSE Discharge Disposition for Follow up and Recommendations: Based on my evaluation I certify that psychiatric inpatient services furnished can reasonably be expected to improve the patient's condition which I recommend transfer to an appropriate accepting facility.   Restarting home medications, reviewed pharmacy dispense report/patient report.  Atrovastain 20 mg daily Olanzapine 10 mg, QTC 466 at bedtime daily Duloxetine  30 mg BID  Gabapentin  400 mg TID Symbicort  2 puffs BID Nicotine  patch 21 mg  Agitation protocol order UDS + Cocaine  -Hold Naltrexone  50 mg daily as patient is actively detoxing from alcohol.  -Will defer to accepting to restart or continue to hold.   Plan: Admit to OBS and transfer to [facility] pending resulting labs.   Tosin Everli Rother, NP 11/19/2023, 5:38 PM

## 2023-11-19 NOTE — Progress Notes (Addendum)
 Venipuncture was unsuccessful X3. Pt encouraged to hydrate.

## 2023-11-19 NOTE — ED Notes (Signed)
 Pt in bed w/eyes closed resting quietly. Respirations equal and unlabored, skin warm and dry. Pt was offered support and encouragement. Pt receptive to treatment and safety maintained on unit. Routine safety checks maintained/ongoing.

## 2023-11-19 NOTE — Progress Notes (Signed)
 Pt is admitted to [facility] due to ETOH and seeking detox. Pt is alert and oriented X3. Pt is ambulatory and is oriented to staff/unit. Pt was cooperative with labs and skin assessment. Pt denies pain and current SI/HI/AVH, plan or intent. Staff will monitor for pt's safety.

## 2023-11-19 NOTE — ED Notes (Signed)
 [facility] gave pt a meal
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
## 2023-11-20 DIAGNOSIS — F1020 Alcohol dependence, uncomplicated: Secondary | ICD-10-CM | POA: Diagnosis present

## 2023-11-20 DIAGNOSIS — F411 Generalized anxiety disorder: Secondary | ICD-10-CM | POA: Diagnosis not present

## 2023-11-20 DIAGNOSIS — F1421 Cocaine dependence, in remission: Secondary | ICD-10-CM | POA: Diagnosis not present

## 2023-11-20 DIAGNOSIS — F322 Major depressive disorder, single episode, severe without psychotic features: Secondary | ICD-10-CM | POA: Diagnosis not present

## 2023-11-20 MED ORDER — DIPHENHYDRAMINE HCL 50 MG PO CAPS: 50.0000 mg | ORAL_CAPSULE | Freq: Three times a day (TID) | ORAL | Status: DC | PRN

## 2023-11-20 MED ORDER — NICOTINE 21 MG/24HR TD PT24
21.0000 mg | MEDICATED_PATCH | Freq: Every day | TRANSDERMAL | Status: DC
  Administered 2023-11-20 – 2023-11-26 (×7): 21 mg via TRANSDERMAL
  Filled 2023-11-20 (×4): qty 1
  Filled 2023-11-20: qty 14
  Filled 2023-11-20 (×3): qty 1

## 2023-11-20 MED ORDER — TRAZODONE HCL 50 MG PO TABS
50.0000 mg | ORAL_TABLET | Freq: Every evening | ORAL | Status: DC | PRN
  Administered 2023-11-20: 50 mg via ORAL
  Filled 2023-11-20: qty 1

## 2023-11-20 MED ORDER — DIPHENHYDRAMINE HCL 50 MG/ML IJ SOLN: 50.0000 mg | Freq: Three times a day (TID) | INTRAMUSCULAR | Status: DC | PRN

## 2023-11-20 MED ORDER — MAGNESIUM HYDROXIDE 400 MG/5ML PO SUSP: 30.0000 mL | Freq: Every day | ORAL | Status: DC | PRN

## 2023-11-20 MED ORDER — LOPERAMIDE HCL 2 MG PO CAPS: 2.0000 mg | ORAL_CAPSULE | ORAL | Status: AC | PRN

## 2023-11-20 MED ORDER — HYDROXYZINE HCL 25 MG PO TABS: 25.0000 mg | ORAL_TABLET | Freq: Four times a day (QID) | ORAL | Status: AC | PRN

## 2023-11-20 MED ORDER — DULOXETINE HCL 30 MG PO CPEP
30.0000 mg | ORAL_CAPSULE | Freq: Two times a day (BID) | ORAL | Status: DC
  Administered 2023-11-20 – 2023-11-23 (×7): 30 mg via ORAL
  Filled 2023-11-20 (×5): qty 1
  Filled 2023-11-20: qty 28
  Filled 2023-11-20 (×2): qty 1

## 2023-11-20 MED ORDER — HALOPERIDOL LACTATE 5 MG/ML IJ SOLN: 5.0000 mg | Freq: Three times a day (TID) | INTRAMUSCULAR | Status: DC | PRN

## 2023-11-20 MED ORDER — HALOPERIDOL 5 MG PO TABS: 5.0000 mg | ORAL_TABLET | Freq: Three times a day (TID) | ORAL | Status: DC | PRN

## 2023-11-20 MED ORDER — ONDANSETRON 4 MG PO TBDP: 4.0000 mg | ORAL_TABLET | Freq: Four times a day (QID) | ORAL | Status: AC | PRN

## 2023-11-20 MED ORDER — LORAZEPAM 1 MG PO TABS
1.0000 mg | ORAL_TABLET | Freq: Every day | ORAL | Status: AC
  Administered 2023-11-23: 1 mg via ORAL
  Filled 2023-11-20: qty 1

## 2023-11-20 MED ORDER — LORAZEPAM 1 MG PO TABS
1.0000 mg | ORAL_TABLET | Freq: Three times a day (TID) | ORAL | Status: AC
  Administered 2023-11-21 (×3): 1 mg via ORAL
  Filled 2023-11-20 (×3): qty 1

## 2023-11-20 MED ORDER — OLANZAPINE 10 MG PO TBDP
10.0000 mg | ORAL_TABLET | Freq: Every day | ORAL | Status: DC
  Administered 2023-11-20 – 2023-11-23 (×4): 10 mg via ORAL
  Filled 2023-11-20 (×4): qty 1

## 2023-11-20 MED ORDER — LORAZEPAM 2 MG/ML IJ SOLN: 2.0000 mg | Freq: Three times a day (TID) | INTRAMUSCULAR | Status: DC | PRN

## 2023-11-20 MED ORDER — THIAMINE MONONITRATE 100 MG PO TABS
100.0000 mg | ORAL_TABLET | Freq: Every day | ORAL | Status: DC
  Administered 2023-11-20 – 2023-11-25 (×6): 100 mg via ORAL
  Filled 2023-11-20 (×4): qty 1
  Filled 2023-11-20: qty 14
  Filled 2023-11-20 (×2): qty 1

## 2023-11-20 MED ORDER — LORAZEPAM 1 MG PO TABS
1.0000 mg | ORAL_TABLET | Freq: Two times a day (BID) | ORAL | Status: AC
  Administered 2023-11-22 (×2): 1 mg via ORAL
  Filled 2023-11-20 (×2): qty 1

## 2023-11-20 MED ORDER — FLUTICASONE FUROATE-VILANTEROL 200-25 MCG/ACT IN AEPB
1.0000 | INHALATION_SPRAY | Freq: Every day | RESPIRATORY_TRACT | Status: DC
  Administered 2023-11-20 – 2023-11-25 (×6): 1 via RESPIRATORY_TRACT
  Filled 2023-11-20: qty 28

## 2023-11-20 MED ORDER — LORAZEPAM 1 MG PO TABS
1.0000 mg | ORAL_TABLET | Freq: Four times a day (QID) | ORAL | Status: AC
  Administered 2023-11-20 (×3): 1 mg via ORAL
  Filled 2023-11-20 (×3): qty 1

## 2023-11-20 MED ORDER — LORAZEPAM 1 MG PO TABS: 1.0000 mg | ORAL_TABLET | Freq: Four times a day (QID) | ORAL | Status: DC | PRN

## 2023-11-20 MED ORDER — ATORVASTATIN CALCIUM 10 MG PO TABS
20.0000 mg | ORAL_TABLET | Freq: Every day | ORAL | Status: DC
  Administered 2023-11-20 – 2023-11-25 (×6): 20 mg via ORAL
  Filled 2023-11-20 (×2): qty 14
  Filled 2023-11-20 (×6): qty 2

## 2023-11-20 MED ORDER — HALOPERIDOL LACTATE 5 MG/ML IJ SOLN: 10.0000 mg | Freq: Three times a day (TID) | INTRAMUSCULAR | Status: DC | PRN

## 2023-11-20 MED ORDER — ALUM & MAG HYDROXIDE-SIMETH 200-200-20 MG/5ML PO SUSP: 30.0000 mL | ORAL | Status: DC | PRN

## 2023-11-20 MED ORDER — ACETAMINOPHEN 325 MG PO TABS: 650.0000 mg | ORAL_TABLET | Freq: Four times a day (QID) | ORAL | Status: DC | PRN

## 2023-11-20 MED ORDER — GABAPENTIN 300 MG PO CAPS
300.0000 mg | ORAL_CAPSULE | Freq: Two times a day (BID) | ORAL | Status: DC
  Administered 2023-11-20 – 2023-11-25 (×12): 300 mg via ORAL
  Filled 2023-11-20 (×3): qty 1
  Filled 2023-11-20: qty 28
  Filled 2023-11-20 (×9): qty 1

## 2023-11-20 MED ORDER — ADULT MULTIVITAMIN W/MINERALS CH
1.0000 | ORAL_TABLET | Freq: Every day | ORAL | Status: DC
  Administered 2023-11-20 – 2023-11-25 (×6): 1 via ORAL
  Filled 2023-11-20: qty 1
  Filled 2023-11-20: qty 14
  Filled 2023-11-20 (×5): qty 1

## 2023-11-20 NOTE — ED Notes (Signed)
 Pt OOB for dinner.  Withdrawn but polite.  Reported feeling ok and denied current W/D symptoms.  Pt is being monitored by [facility] protocol and is on an Ativan  taper. Denied current SI plan and intent.  Denied HI and A/V hallucinations. Q 15 minute observations for safety continue

## 2023-11-20 NOTE — Group Note (Signed)
 Group Topic: Relapse and Recovery  Group Date: 11/20/2023 Start Time: 1900 End Time: 2000 Facilitators: Wendall Halls B  Department: [facility]  Number of Participants: 6  Group Focus: abuse issues, activities of daily living skills, and coping skills Treatment Modality:  Psychoeducation Interventions utilized were group exercise Purpose: enhance coping skills, express feelings, relapse prevention strategies, and trigger / craving management  Name: Lauren Blackwell Date of Birth: 11-Dec-1965  MR: [id number]    Level of Participation: PT DID NOT ATTEND GROUP Quality of Participation: restless Interactions with others: NA Mood/Affect: appropriate Triggers (if applicable): NA Cognition: coherent/clear Progress: None Response: NA Plan: patient will be encouraged to go to groups.   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/2023   Alcohol-induced mood disorder (HCC) 01/09/2023   Alcohol use disorder 01/09/2023   Other emphysema (HCC) 11/21/2022   Aortic atherosclerosis (HCC) 11/21/2022   Right lower lobe pulmonary nodule 11/21/2022   GAD (generalized anxiety disorder) 11/21/2022   Bipolar disorder, in full remission, most recent episode mixed (HCC) 11/21/2022   Tobacco abuse 11/21/2022   Alcohol abuse 11/21/2022   Cocaine abuse (HCC) 11/21/2022   Methamphetamine abuse (HCC) 11/21/2022   Acute hypoxemic respiratory failure (HCC) 07/08/2021   Methamphetamine use disorder, severe, dependence (HCC) 07/08/2021   Opioid overdose (HCC) 07/08/2021   Respiratory failure (HCC) 07/08/2021

## 2023-11-20 NOTE — Discharge Planning (Signed)
 SW received call from [facility] with approval for patient to admit on Monday. SW had her hold this bed in place but will continue to seek other placement offers.

## 2023-11-20 NOTE — Discharge Planning (Signed)
 LCSW met with patient to assess current mood, affect, physical state, and inquire about needs/goals while here in [facility] and after discharge. Patient is alert and oriented but very sleepy and difficult to stay aroused during this assessment.  Patient reports she presented [facility] for alcohol and cocaine detox. Patient reports [facility] and per EMR left due to sever alcohol cravings and left the program 3 weeks ago. Patient has been living homeless and reportedly drinking 4-5 40 ounce beers daily and 100$ worth of crack cocaine daily. Patient denies having access to transportation, and reports having limited social support. Patient stated she has several numbers of supports on her phone but never uses them. Patient reports his/her current goal is to seek residential placement at [facility] for substance use. Patient currently denies any SI/HI/AVH. Patient aware that LCSW will send referrals out for review and will follow up to provide updates as received. Patient expressed understanding and appreciation of LCSW assistance. No other needs were reported at this time by patient.

## 2023-11-20 NOTE — ED Notes (Signed)
Patient is sleeping. Respirations equal and unlabored, skin warm and dry, NAD. No change in assessment or acuity. Routine safety checks conducted according to facility protocol. Will continue to monitor for safety.   

## 2023-11-20 NOTE — ED Notes (Addendum)
 Pt is in his room resting in bed. Pt denies SI/HI/AVH. No acute distress noted. Will continue to monitor for safety.

## 2023-11-20 NOTE — ED Notes (Signed)
 Pt is up walking around confused. She doesn't know where her room is at. She thinks its time to get up. This MHT explained its still night time and walked her to her room. Pt then came back out tried to go down the mens hall and I directed her back to her room.
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
## 2023-11-20 NOTE — ED Notes (Signed)
 Pt presents to [facility] from [facility] to get help with  Alcohol abuse.Pt reports using cocaine as well and hope to get admission into [facility] treatment center.  Admission process completed. Pt is oriented to the unit and provided with meal. . Pt denies SI/HI/AVH. Will continue to monitor for safety and provide support.

## 2023-11-20 NOTE — ED Notes (Signed)
 Pt has been in bed much of shift thus far.  She declined breakfast and lunch.  Pt provided with fluids.  Pt denied current ETOh withdrawal symptoms. No other needs expressed at this time 15 minute observations for safety continue

## 2023-11-20 NOTE — ED Notes (Signed)
 Pt presented lying in bed.  Pt reported feeling tired and fidgety at present.  Pt did not wish to engage conversation anymore Q 15 minute observations for safety continue

## 2023-11-20 NOTE — ED Notes (Signed)
 Patient is sleeping. Respirations equal and unlabored, skin warm and dry. No change in assessment or acuity. Routine safety checks conducted according to facility protocol. Will continue to monitor for safety.

## 2023-11-21 DIAGNOSIS — F1020 Alcohol dependence, uncomplicated: Secondary | ICD-10-CM | POA: Diagnosis not present

## 2023-11-21 DIAGNOSIS — F322 Major depressive disorder, single episode, severe without psychotic features: Secondary | ICD-10-CM | POA: Diagnosis not present

## 2023-11-21 DIAGNOSIS — F1421 Cocaine dependence, in remission: Secondary | ICD-10-CM | POA: Diagnosis not present

## 2023-11-21 DIAGNOSIS — F411 Generalized anxiety disorder: Secondary | ICD-10-CM | POA: Diagnosis not present

## 2023-11-21 MED ORDER — LORAZEPAM 1 MG PO TABS: 1.0000 mg | ORAL_TABLET | ORAL | Status: DC | PRN

## 2023-11-21 MED ORDER — LORAZEPAM 1 MG PO TABS: 1.0000 mg | ORAL_TABLET | ORAL | Status: AC | PRN

## 2023-11-21 MED ORDER — TRAZODONE HCL 50 MG PO TABS
50.0000 mg | ORAL_TABLET | Freq: Every day | ORAL | Status: DC
  Administered 2023-11-21 – 2023-11-25 (×5): 50 mg via ORAL
  Filled 2023-11-21 (×2): qty 1
  Filled 2023-11-21: qty 14
  Filled 2023-11-21 (×5): qty 1
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
## 2023-11-21 NOTE — ED Notes (Signed)
 Patient resting with eyes closed in no apparent acute distress. Respirations even and unlabored. Environment secured. Safety checks in place according to facility policy.

## 2023-11-21 NOTE — Group Note (Signed)
 Group Topic: Wellness  Group Date: 11/21/2023 Start Time: 1720 End Time: 1745 Facilitators: Pennie Box, RN  Department: [facility]  Number of Participants: 10  Group Focus: nursing group Treatment Modality:  Skills Training Interventions utilized were group exercise and patient education Purpose: improve communication skills  Name: Lauren Blackwell Date of Birth: 1965-08-21  MR: [id number]    Level of Participation: minimal Quality of Participation: withdrawn Interactions with others: pt left early without providing feedback Mood/Affect: flat Triggers (if applicable): n/a Cognition: pt left early without providing feedback Progress: Minimal Response: pt left early without providing feedback Plan: patient will be encouraged to future RN groups  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/2023   Alcohol-induced mood disorder (HCC) 01/09/2023   Alcohol use disorder 01/09/2023   Other emphysema (HCC) 11/21/2022   Aortic atherosclerosis (HCC) 11/21/2022   Right lower lobe pulmonary nodule 11/21/2022   GAD (generalized anxiety disorder) 11/21/2022   Bipolar disorder, in full remission, most recent episode mixed (HCC) 11/21/2022   Tobacco abuse 11/21/2022   Alcohol abuse 11/21/2022   Cocaine abuse (HCC) 11/21/2022   Methamphetamine abuse (HCC) 11/21/2022   Acute hypoxemic respiratory failure (HCC) 07/08/2021   Methamphetamine use disorder, severe, dependence (HCC) 07/08/2021   Opioid overdose (HCC) 07/08/2021   Respiratory failure (HCC) 07/08/2021

## 2023-11-21 NOTE — Group Note (Signed)
 Group Topic: Positive Affirmations  Group Date: 11/21/2023 Start Time: 1130 End Time: 1150 Facilitators: Esther Hem, NT  Department: [facility]  Number of Participants: 8  Group Focus: positive affirmation and other self worth Treatment Modality:  Psychoeducation Interventions utilized were support Purpose: increase insight  Name: Lauren Blackwell Date of Birth: 01-12-1966  MR: [id number]    Level of Participation: PT refused to attend group Quality of Participation: PT did not participate Interactions with others: No interaction Mood/Affect: PT has been sleep, not wanting to participate in any activities other than sleep Triggers (if applicable): n/a Cognition: n/a Progress: Other Response: PT was encouraged to attend the group being held in the courtyard but PT did not attend Plan: patient will be encouraged to attend groups  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/2023   Alcohol-induced mood disorder (HCC) 01/09/2023   Alcohol use disorder 01/09/2023   Other emphysema (HCC) 11/21/2022   Aortic atherosclerosis (HCC) 11/21/2022   Right lower lobe pulmonary nodule 11/21/2022   GAD (generalized anxiety disorder) 11/21/2022   Bipolar disorder, in full remission, most recent episode mixed (HCC) 11/21/2022   Tobacco abuse 11/21/2022   Alcohol abuse 11/21/2022   Cocaine abuse (HCC) 11/21/2022   Methamphetamine abuse (HCC) 11/21/2022   Acute hypoxemic respiratory failure (HCC) 07/08/2021   Methamphetamine use disorder, severe, dependence (HCC) 07/08/2021   Opioid overdose (HCC) 07/08/2021   Respiratory failure (HCC) 07/08/2021

## 2023-11-21 NOTE — Group Note (Unsigned)
 Group Topic: Boundaries  Group Date: 11/21/2023 Start Time: 0300 End Time: 0400 Facilitators: Merline Starr, [place]  Department: [facility]  Number of Participants: 9  Group Focus: coping skills Treatment Modality:  Psychoeducation Interventions utilized were patient education Purpose: explore maladaptive thinking, improve communication skills, reinforce self-care, and trigger / craving management   Name: Lauren Blackwell Date of Birth: 1965/10/05  MR: [id number]    Level of Participation: {THERAPIES; PSYCH GROUP PARTICIPATION MVHQI:69629} Quality of Participation: {THERAPIES; PSYCH QUALITY OF PARTICIPATION:23992} Interactions with others: {THERAPIES; PSYCH INTERACTIONS:23993} Mood/Affect: {THERAPIES; PSYCH MOOD/AFFECT:23994} Triggers (if applicable): *** Cognition: {THERAPIES; PSYCH COGNITION:23995} Progress: {THERAPIES; PSYCH PROGRESS:23997} Response: *** Plan: {THERAPIES; PSYCH BMWU:13244}  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/2023   Alcohol-induced mood disorder (HCC) 01/09/2023   Alcohol use disorder 01/09/2023   Other emphysema (HCC) 11/21/2022   Aortic atherosclerosis (HCC) 11/21/2022   Right lower lobe pulmonary nodule 11/21/2022   GAD (generalized anxiety disorder) 11/21/2022   Bipolar disorder, in full remission, most recent episode mixed (HCC) 11/21/2022   Tobacco abuse 11/21/2022   Alcohol abuse 11/21/2022   Cocaine abuse (HCC) 11/21/2022   Methamphetamine abuse (HCC) 11/21/2022   Acute hypoxemic respiratory failure (HCC) 07/08/2021   Methamphetamine use disorder, severe, dependence (HCC) 07/08/2021   Opioid overdose (HCC) 07/08/2021   Respiratory failure (HCC) 07/08/2021

## 2023-11-21 NOTE — Group Note (Signed)
 Group Topic: Relapse and Recovery  Group Date: 11/21/2023 Start Time: 2000 End Time: 2100 Facilitators: Wendall Halls B  Department: [facility]  Number of Participants: 2  Group Focus: abuse issues, co-dependency, communication, coping skills, daily focus, goals/reality orientation, healthy friendships, and relapse prevention Treatment Modality:  Exposure Therapy Interventions utilized were leisure development Purpose: enhance coping skills, express feelings, express irrational fears, and relapse prevention strategies  Name: Lauren Blackwell Date of Birth: [birth year]  MR: [id number]    Level of Participation: PT DID NOT ATTEND GROUP Quality of Participation: cooperative Interactions with others: gave feedback Mood/Affect: appropriate Triggers (if applicable): NA Cognition: coherent/clear Progress: None Response: NA Plan: patient will be encouraged to go to groups.   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/2023   Alcohol-induced mood disorder (HCC) 01/09/2023   Alcohol use disorder 01/09/2023   Other emphysema (HCC) 11/21/2022   Aortic atherosclerosis (HCC) 11/21/2022   Right lower lobe pulmonary nodule 11/21/2022   GAD (generalized anxiety disorder) 11/21/2022   Bipolar disorder, in full remission, most recent episode mixed (HCC) 11/21/2022   Tobacco abuse 11/21/2022   Alcohol abuse 11/21/2022   Cocaine abuse (HCC) 11/21/2022   Methamphetamine abuse (HCC) 11/21/2022   Acute hypoxemic respiratory failure (HCC) 07/08/2021   Methamphetamine use disorder, severe, dependence (HCC) 07/08/2021   Opioid overdose (HCC) 07/08/2021   Respiratory failure (HCC) 07/08/2021

## 2023-11-21 NOTE — ED Notes (Signed)
 Patient alert & oriented x4. Denies intent to harm self or others when asked. Denies A/VH. Patient reports generalized discomfort, scheduled gabapentin  administered along with other scheduled medications with no complications. No acute distress noted. Support and encouragement provided. Routine safety checks conducted per facility protocol. Encouraged patient to notify staff if any thoughts of harm towards self or others arise. Patient verbalizes understanding and agreement.

## 2023-11-21 NOTE — ED Notes (Signed)
 Patient is sleeping. Respirations equal and unlabored, skin warm and dry. No change in assessment or acuity. Routine safety checks conducted according to facility protocol. Will continue to monitor for safety.

## 2023-11-22 DIAGNOSIS — F1421 Cocaine dependence, in remission: Secondary | ICD-10-CM | POA: Diagnosis not present

## 2023-11-22 DIAGNOSIS — F322 Major depressive disorder, single episode, severe without psychotic features: Secondary | ICD-10-CM | POA: Diagnosis not present

## 2023-11-22 DIAGNOSIS — F1020 Alcohol dependence, uncomplicated: Secondary | ICD-10-CM | POA: Diagnosis not present

## 2023-11-22 DIAGNOSIS — F411 Generalized anxiety disorder: Secondary | ICD-10-CM | POA: Diagnosis not present

## 2023-11-22 NOTE — Group Note (Signed)
 Group Topic: Social Support  Group Date: 11/22/2023 Start Time: 1710 End Time: 1720 Facilitators: Esther Hem, NT  Department: [facility]  Number of Participants: 12  Group Focus: reminiscence Treatment Modality:  Psychoeducation Interventions utilized were reminiscence and support Purpose: increase insight  Name: Lauren Blackwell Date of Birth: [birth year]  MR: [id number]    Level of Participation: PT was present but did not participate Quality of Participation: withdrawn Interactions with others: No interaction Mood/Affect: indifferent Triggers (if applicable): n/a Cognition: coherent/clear Progress: Other Response: n/a Plan: patient will be encouraged to attend group  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/2023   Alcohol-induced mood disorder (HCC) 01/09/2023   Alcohol use disorder 01/09/2023   Other emphysema (HCC) 11/21/2022   Aortic atherosclerosis (HCC) 11/21/2022   Right lower lobe pulmonary nodule 11/21/2022   GAD (generalized anxiety disorder) 11/21/2022   Bipolar disorder, in full remission, most recent episode mixed (HCC) 11/21/2022   Tobacco abuse 11/21/2022   Alcohol abuse 11/21/2022   Cocaine abuse (HCC) 11/21/2022   Methamphetamine abuse (HCC) 11/21/2022   Acute hypoxemic respiratory failure (HCC) 07/08/2021   Methamphetamine use disorder, severe, dependence (HCC) 07/08/2021   Opioid overdose (HCC) 07/08/2021   Respiratory failure (HCC) 07/08/2021

## 2023-11-22 NOTE — ED Notes (Signed)
 Patient is sleeping. Respirations equal and unlabored, skin warm and dry. No change in assessment or acuity. Routine safety checks conducted according to facility protocol. Will continue to monitor for safety.

## 2023-11-22 NOTE — ED Notes (Signed)
 Pt sleeping in no acute distress. RR even and unlabored. Environment secured. Will continue to monitor for safety.
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
## 2023-11-22 NOTE — Group Note (Signed)
 Group Topic: Relaxation  Group Date: 11/22/2023 Start Time: 0800 End Time: 0865 Facilitators: Lestine Rathke, NT  Department: [facility]  Number of Participants: 5  Group Focus: coping skills Treatment Modality:  Behavior Modification Therapy and Cognitive Behavioral Therapy Interventions utilized were assignment, clarification, and group exercise Purpose: enhance coping skills and increase insight  Name: Lauren Blackwell Date of Birth: 05-14-1966  MR: [id number]    Level of Participation: Pt did not attend meeting Quality of Participation: n/a Interactions with others: n/a Mood/Affect: n/a Triggers (if applicable): n/a Cognition: n/a Progress: None Response: n/a Plan: patient will be encouraged to attend all scheduled activities on the unit.  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/2023   Alcohol-induced mood disorder (HCC) 01/09/2023   Alcohol use disorder 01/09/2023   Other emphysema (HCC) 11/21/2022   Aortic atherosclerosis (HCC) 11/21/2022   Right lower lobe pulmonary nodule 11/21/2022   GAD (generalized anxiety disorder) 11/21/2022   Bipolar disorder, in full remission, most recent episode mixed (HCC) 11/21/2022   Tobacco abuse 11/21/2022   Alcohol abuse 11/21/2022   Cocaine abuse (HCC) 11/21/2022   Methamphetamine abuse (HCC) 11/21/2022   Acute hypoxemic respiratory failure (HCC) 07/08/2021   Methamphetamine use disorder, severe, dependence (HCC) 07/08/2021   Opioid overdose (HCC) 07/08/2021   Respiratory failure (HCC) 07/08/2021

## 2023-11-22 NOTE — Discharge Instructions (Signed)
 Referral was received and per Lauren Blackwell, patient has been accepted and can transfer to the facility on Monday 11/26/23 by 8:00am. Update has been provided to the patient and MD made aware. Patient will need a 7-14 day supply of medication and one month refill. No nicotine  gum allowed, however patches are if ordered. No other needs to report at this time.    Patient will be discharging to [facility] and will need cab to arrive at 8:00 am with transportation provided via Taxi. Address is [address] [place], [place] [address]. Number to call for emergency is [phone]    [name], LCSW Clinical Social Worker [place] Ph: [phone]

## 2023-11-22 NOTE — ED Notes (Addendum)
 Patient A&Ox4. Denies intent to harm self/others when asked. Denies A/VH. Patient denies any physical complaints when asked. No acute distress noted. Support and encouragement provided. Received am medication without difficulty. Routine safety checks conducted according to facility protocol. Encouraged patient to notify staff if thoughts of harm toward self or others arise. Patient verbalize understanding and agreement. Will continue to monitor for safety.

## 2023-11-23 DIAGNOSIS — F1020 Alcohol dependence, uncomplicated: Secondary | ICD-10-CM | POA: Diagnosis not present

## 2023-11-23 DIAGNOSIS — F411 Generalized anxiety disorder: Secondary | ICD-10-CM | POA: Diagnosis not present

## 2023-11-23 DIAGNOSIS — F322 Major depressive disorder, single episode, severe without psychotic features: Secondary | ICD-10-CM | POA: Diagnosis not present

## 2023-11-23 DIAGNOSIS — F1421 Cocaine dependence, in remission: Secondary | ICD-10-CM | POA: Diagnosis not present

## 2023-11-23 MED ORDER — DULOXETINE HCL 30 MG PO CPEP
30.0000 mg | ORAL_CAPSULE | Freq: Two times a day (BID) | ORAL | Status: DC
  Administered 2023-11-23 – 2023-11-25 (×5): 30 mg via ORAL
  Filled 2023-11-23 (×5): qty 1

## 2023-11-23 NOTE — ED Notes (Signed)
 Patient calm on unit awake for breakfast.  No distress or withdrawal.  Patient has now returned to room and is resting quietly.

## 2023-11-23 NOTE — Group Note (Signed)
 Group Topic: Communication  Group Date: 11/23/2023 Start Time: 1000 End Time: 1045 Facilitators: Ashby Blackwater, NT  Department: [facility]  Number of Participants: 11  Group Focus: communication, problem solving, self-awareness, and social skills Treatment Modality:  Skills Training Interventions utilized were group exercise Purpose: improve communication skills, regain self-worth, and reinforce self-care  Name: Lauren Blackwell Date of Birth: 11-Jul-1965  MR: [id number]    Level of Participation: active Quality of Participation: engaged Interactions with others: gave feedback Mood/Affect: bright Triggers (if applicable): none Cognition: insightful Progress: Gaining insight Response: none Plan: patient will be encouraged to attend group  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/2023   Alcohol-induced mood disorder (HCC) 01/09/2023   Alcohol use disorder 01/09/2023   Other emphysema (HCC) 11/21/2022   Aortic atherosclerosis (HCC) 11/21/2022   Right lower lobe pulmonary nodule 11/21/2022   GAD (generalized anxiety disorder) 11/21/2022   Bipolar disorder, in full remission, most recent episode mixed (HCC) 11/21/2022   Tobacco abuse 11/21/2022   Alcohol abuse 11/21/2022   Cocaine abuse (HCC) 11/21/2022   Methamphetamine abuse (HCC) 11/21/2022   Acute hypoxemic respiratory failure (HCC) 07/08/2021   Methamphetamine use disorder, severe, dependence (HCC) 07/08/2021   Opioid overdose (HCC) 07/08/2021   Respiratory failure (HCC) 07/08/2021

## 2023-11-23 NOTE — Group Note (Signed)
 Group Topic: Fears and Unhealthy Coping Skills  Group Date: 11/23/2023 Start Time: 2030 End Time: 2100 Facilitators: Alvino Joseph, NT  Department: [facility]  Number of Participants: 8  Group Focus: triggers Treatment Modality:  Individual Therapy Interventions utilized were group exercise Purpose: trigger / craving management  Name: Canda Podgorski Date of Birth: [birth year]  MR: [id number]    Level of Participation: n/a Quality of Participation: n/a Interactions with others: n/a Mood/Affect: n/a Triggers (if applicable): n/a Cognition: n/a Progress: None Response: n/a Plan: patient will be encouraged to attend group  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/2023   Alcohol-induced mood disorder (HCC) 01/09/2023   Alcohol use disorder 01/09/2023   Other emphysema (HCC) 11/21/2022   Aortic atherosclerosis (HCC) 11/21/2022   Right lower lobe pulmonary nodule 11/21/2022   GAD (generalized anxiety disorder) 11/21/2022   Bipolar disorder, in full remission, most recent episode mixed (HCC) 11/21/2022   Tobacco abuse 11/21/2022   Alcohol abuse 11/21/2022   Cocaine abuse (HCC) 11/21/2022   Methamphetamine abuse (HCC) 11/21/2022   Acute hypoxemic respiratory failure (HCC) 07/08/2021   Methamphetamine use disorder, severe, dependence (HCC) 07/08/2021   Opioid overdose (HCC) 07/08/2021   Respiratory failure (HCC) 07/08/2021

## 2023-11-23 NOTE — ED Provider Notes (Signed)
 Behavioral Health Progress Note  Date and Time: 11/23/2023 7:13 PM Name: Lauren Blackwell MRN:  [id number]  HPI: Lauren Blackwell is a 58 y.o. female with prior reported mental health diagnoses of MDD, GAD, and PTSD who presented to the [facility] on 11/19/2023 with complaints of worsening depressive symptoms in the context of her recent relapse on alcohol and "crack" cocaine after 10 months of sobriety while residing & working at a [employer]). Pt requested assistance with detox and of alcohol, and with regaining her sobriety, and was admitted into the facility based crisis of the [facility] in hopes of stabilizing her mental health status & regaining entrance into a sober living arrangement.  Patient assessment, 11/23/2023: Pt with flat affect and depressed mood, attention to personal hygiene and grooming is remaining poor, pt appears disheveled & unkempt, and the need to tend to personal hygiene and grooming has been reiterated. Her eye contact is fair, speech is clear & coherent. Thought contents are organized and logical, and pt currently denies SI/HI/AVH or paranoia. There is no evidence of delusional thoughts.   Pt reports that depression is continuing to be rated as a 10, 10 being worst, she is staying mostly reclusive and isolative to her room, has been given positive verbal reinforcements to engage in unit activities, but has been minimally interactive.  Patient is not engaging with peers.  Patient has been reminded by writer that it is the expectation that she will participate in unit activities.  She has verbalized understanding.  She is reporting appetite has been poor today, states that she is making efforts to eat.  She denies being in any physical pain today.  She reports her sleep last night was good, denies medication related side effects.  Denies any symptoms of alcohol withdrawals today, states that medications  are helpful.  Denies medication related side effects.  Cymbalta  is ordered as 30 mg twice daily x 13 doses, we will change to a continuous medication, and will not make any other adjustments to medications for now.  Discharge date for patient is 6/09, and she will be going to [facility] rehabilitation services on that day. She verbalizes readiness to go on that day.  Labs reviewed: Orders placed for vitamin B12, and vitamin D .  Diagnosis:  Final diagnoses:  None    Total Time spent with patient: 45 minutes  Sleep: Fair  Appetite:  Fair  Current Medications:  Current Facility-Administered Medications  Medication Dose Route Frequency Provider Last Rate Last Admin   acetaminophen  (TYLENOL ) tablet 650 mg  650 mg Oral Q6H PRN Olasunkanmi, Oluwatosin, NP       alum & mag hydroxide-simeth (MAALOX/MYLANTA) 200-200-20 MG/5ML suspension 30 mL  30 mL Oral Q4H PRN Olasunkanmi, Oluwatosin, NP       atorvastatin (LIPITOR) tablet 20 mg  20 mg Oral Daily Olasunkanmi, Oluwatosin, NP   20 mg at 11/23/23 0959   haloperidol (HALDOL) tablet 5 mg  5 mg Oral TID PRN Olasunkanmi, Oluwatosin, NP       And   diphenhydrAMINE (BENADRYL) capsule 50 mg  50 mg Oral TID PRN Olasunkanmi, Oluwatosin, NP       haloperidol lactate (HALDOL) injection 5 mg  5 mg Intramuscular TID PRN Olasunkanmi, Oluwatosin, NP       And   diphenhydrAMINE (BENADRYL) injection 50 mg  50 mg Intramuscular TID PRN Olasunkanmi, Oluwatosin, NP       And   LORazepam  (ATIVAN ) injection 2  mg  2 mg Intramuscular TID PRN Olasunkanmi, Oluwatosin, NP       haloperidol lactate (HALDOL) injection 10 mg  10 mg Intramuscular TID PRN Olasunkanmi, Oluwatosin, NP       And   diphenhydrAMINE (BENADRYL) injection 50 mg  50 mg Intramuscular TID PRN Olasunkanmi, Oluwatosin, NP       And   LORazepam  (ATIVAN ) injection 2 mg  2 mg Intramuscular TID PRN Olasunkanmi, Oluwatosin, NP       DULoxetine  (CYMBALTA ) DR capsule 30 mg  30 mg Oral BID Charlette Hennings, NP        fluticasone furoate-vilanterol (BREO ELLIPTA) 200-25 MCG/ACT 1 puff  1 puff Inhalation Daily Olasunkanmi, Oluwatosin, NP   1 puff at 11/23/23 1002   gabapentin  (NEURONTIN ) capsule 300 mg  300 mg Oral BID Olasunkanmi, Oluwatosin, NP   300 mg at 11/23/23 4098   LORazepam  (ATIVAN ) tablet 1 mg  1 mg Oral Q1H PRN Bethea, Terrence C, MD       magnesium  hydroxide (MILK OF MAGNESIA) suspension 30 mL  30 mL Oral Daily PRN Olasunkanmi, Oluwatosin, NP       multivitamin with minerals tablet 1 tablet  1 tablet Oral Daily Olasunkanmi, Oluwatosin, NP   1 tablet at 11/23/23 0959   nicotine  (NICODERM CQ  - dosed in mg/24 hours) patch 21 mg  21 mg Transdermal Q0600 Olasunkanmi, Oluwatosin, NP   21 mg at 11/23/23 1001   OLANZapine zydis (ZYPREXA) disintegrating tablet 10 mg  10 mg Oral QHS Olasunkanmi, Oluwatosin, NP   10 mg at 11/22/23 2129   thiamine  (VITAMIN B1) tablet 100 mg  100 mg Oral Daily Olasunkanmi, Oluwatosin, NP   100 mg at 11/23/23 0959   traZODone (DESYREL) tablet 50 mg  50 mg Oral QHS Bethea, Terrence C, MD   50 mg at 11/22/23 2129   Current Outpatient Medications  Medication Sig Dispense Refill   albuterol  (VENTOLIN  HFA) 108 (90 Base) MCG/ACT inhaler Inhale 1-2 puffs into the lungs every 6 (six) hours as needed for wheezing or shortness of breath. 6.7 g 1   atorvastatin (LIPITOR) 20 MG tablet Take 20 mg by mouth at bedtime.     budesonide -formoterol  (SYMBICORT ) 160-4.5 MCG/ACT inhaler Inhale 2 puffs into the lungs 2 (two) times daily. 1 each 1   cetirizine  (ZYRTEC  ALLERGY) 10 MG tablet Take 1 tablet (10 mg total) by mouth daily. 30 tablet 1   DULoxetine  (CYMBALTA ) 30 MG capsule Take 1 capsule (30 mg total) by mouth daily. (Patient taking differently: Take 30 mg by mouth 2 (two) times daily.) 30 capsule 0   gabapentin  (NEURONTIN ) 400 MG capsule Take 400 mg by mouth 3 (three) times daily.     hydrOXYzine  (ATARAX ) 25 MG tablet Take 12.5-25 mg by mouth 3 (three) times daily as needed for anxiety.      mirtazapine (REMERON) 15 MG tablet Take 22.5 mg by mouth at bedtime as needed (For sleep).     naltrexone  (DEPADE) 50 MG tablet Take 100 mg by mouth daily.     OLANZapine (ZYPREXA) 5 MG tablet Take 5 mg by mouth daily.      Labs  Lab Results:  Admission on 11/19/2023, Discharged on 11/19/2023  Component Date Value Ref Range Status   WBC 11/19/2023 6.3  4.0 - 10.5 K/uL Final   RBC 11/19/2023 4.92  3.87 - 5.11 MIL/uL Final   Hemoglobin 11/19/2023 15.4 (H)  12.0 - 15.0 g/dL Final   HCT 11/91/4782 44.4  36.0 - 46.0 % Final  MCV 11/19/2023 90.2  80.0 - 100.0 fL Final   MCH 11/19/2023 31.3  26.0 - 34.0 pg Final   MCHC 11/19/2023 34.7  30.0 - 36.0 g/dL Final   RDW 16/03/9603 13.4  11.5 - 15.5 % Final   Platelets 11/19/2023 338  150 - 400 K/uL Final   nRBC 11/19/2023 0.0  0.0 - 0.2 % Final   Neutrophils Relative % 11/19/2023 47  % Final   Neutro Abs 11/19/2023 3.0  1.7 - 7.7 K/uL Final   Lymphocytes Relative 11/19/2023 41  % Final   Lymphs Abs 11/19/2023 2.6  0.7 - 4.0 K/uL Final   Monocytes Relative 11/19/2023 9  % Final   Monocytes Absolute 11/19/2023 0.5  0.1 - 1.0 K/uL Final   Eosinophils Relative 11/19/2023 2  % Final   Eosinophils Absolute 11/19/2023 0.1  0.0 - 0.5 K/uL Final   Basophils Relative 11/19/2023 1  % Final   Basophils Absolute 11/19/2023 0.0  0.0 - 0.1 K/uL Final   Immature Granulocytes 11/19/2023 0  % Final   Abs Immature Granulocytes 11/19/2023 0.01  0.00 - 0.07 K/uL Final   Performed at [facility], [address], [place], [place] [address]   Sodium 11/19/2023 140  135 - 145 mmol/L Final   Potassium 11/19/2023 3.9  3.5 - 5.1 mmol/L Final   Chloride 11/19/2023 101  98 - 111 mmol/L Final   CO2 11/19/2023 28  22 - 32 mmol/L Final   Glucose, Bld 11/19/2023 122 (H)  70 - 99 mg/dL Final   Glucose reference range applies only to samples taken after fasting for at least 8 hours.   BUN 11/19/2023 7  6 - 20 mg/dL Final   Creatinine, Ser 11/19/2023 0.73  0.44 - 1.00  mg/dL Final   Calcium 11/91/4782 9.0  8.9 - 10.3 mg/dL Final   Total Protein 95/62/1308 5.8 (L)  6.5 - 8.1 g/dL Final   Albumin 65/78/4696 3.1 (L)  3.5 - 5.0 g/dL Final   AST 29/52/8413 20  15 - 41 U/L Final   ALT 11/19/2023 16  0 - 44 U/L Final   Alkaline Phosphatase 11/19/2023 90  38 - 126 U/L Final   Total Bilirubin 11/19/2023 0.4  0.0 - 1.2 mg/dL Final   GFR, Estimated 11/19/2023 >60  >60 mL/min Final   Comment: (NOTE) Calculated using the CKD-EPI Creatinine Equation (2021)    Anion gap 11/19/2023 11  5 - 15 Final   Performed at [facility], [address], [place], [place] [address]   Alcohol, Ethyl (B) 11/19/2023 <15  <15 mg/dL Final   Comment: (NOTE) For medical purposes only. Performed at [facility], [address] [place], [place] [address]    Hgb A1c MFr Bld 11/19/2023 5.3  4.8 - 5.6 % Final   Comment: (NOTE) Diagnosis of Diabetes The following HbA1c ranges recommended by the American Diabetes Association (ADA) may be used as an aid in the diagnosis of diabetes mellitus.  Hemoglobin             Suggested A1C NGSP%              Diagnosis  <5.7                   Non Diabetic  5.7-6.4                Pre-Diabetic  >6.4  Diabetic  <7.0                   Glycemic control for                       adults with diabetes.     Mean Plasma Glucose 11/19/2023 105.41  mg/dL Final   Performed at [facility], [address] [place], [place] [address]   Cholesterol 11/19/2023 163  0 - 200 mg/dL Final   Triglycerides 60/45/4098 65  <150 mg/dL Final   HDL 11/91/4782 51  >40 mg/dL Final   Total CHOL/HDL Ratio 11/19/2023 3.2  RATIO Final   VLDL 11/19/2023 13  0 - 40 mg/dL Final   LDL Cholesterol 11/19/2023 99  0 - 99 mg/dL Final   Comment:        Total Cholesterol/HDL:CHD Risk Coronary Heart Disease Risk Table                     Men   Women  1/2 Average Risk   3.4   3.3  Average Risk       5.0   4.4  2 X Average Risk   9.6   7.1  3  X Average Risk  23.4   11.0        Use the calculated Patient Ratio above and the CHD Risk Table to determine the patient's CHD Risk.        ATP III CLASSIFICATION (LDL):  <100     mg/dL   Optimal  956-213  mg/dL   Near or Above                    Optimal  130-159  mg/dL   Borderline  086-578  mg/dL   High  >469     mg/dL   Very High Performed at [facility], [address] [place], [place] [address]    TSH 11/19/2023 1.767  0.350 - 4.500 uIU/mL Final   Comment: Performed by a 3rd Generation assay with a functional sensitivity of <=0.01 uIU/mL. Performed at [facility], [address], [place], [place] [address]    POC Amphetamine UR 11/19/2023 None Detected  NONE DETECTED (Cut Off Level 1000 ng/mL) Final   POC Secobarbital (BAR) 11/19/2023 None Detected  NONE DETECTED (Cut Off Level 300 ng/mL) Final   POC Buprenorphine (BUP) 11/19/2023 None Detected  NONE DETECTED (Cut Off Level 10 ng/mL) Final   POC Oxazepam (BZO) 11/19/2023 None Detected  NONE DETECTED (Cut Off Level 300 ng/mL) Final   POC Cocaine UR 11/19/2023 Positive (A)  NONE DETECTED (Cut Off Level 300 ng/mL) Final   POC Methamphetamine UR 11/19/2023 None Detected  NONE DETECTED (Cut Off Level 1000 ng/mL) Final   POC Morphine 11/19/2023 None Detected  NONE DETECTED (Cut Off Level 300 ng/mL) Final   POC Methadone UR 11/19/2023 None Detected  NONE DETECTED (Cut Off Level 300 ng/mL) Final   POC Oxycodone UR 11/19/2023 None Detected  NONE DETECTED (Cut Off Level 100 ng/mL) Final   POC Marijuana UR 11/19/2023 None Detected  NONE DETECTED (Cut Off Level 50 ng/mL) Final    Blood Alcohol level:  Lab Results  Component Value Date   ETH <15 11/19/2023   ETH 45 (H) 01/09/2023    Metabolic Disorder Labs: Lab Results  Component Value Date   HGBA1C 5.3 11/19/2023   MPG 105.41 11/19/2023   No results found for: "  PROLACTIN" Lab Results  Component Value Date   CHOL 163 11/19/2023   TRIG 65 11/19/2023   HDL 51  11/19/2023   CHOLHDL 3.2 11/19/2023   VLDL 13 11/19/2023   LDLCALC 99 11/19/2023   LDLCALC 102 (H) 01/10/2023    Therapeutic Lab Levels: No results found for: "LITHIUM" No results found for: "VALPROATE" No results found for: "CBMZ"  Physical Findings   AUDIT    Flowsheet Row ED from 01/09/2023 in [facility]  Alcohol Use Disorder Identification Test Final Score (AUDIT) 40      PHQ2-9    Flowsheet Row ED from 11/19/2023 in [facility] ED from 01/09/2023 in [facility]  PHQ-2 Total Score 4 6  PHQ-9 Total Score 15 23      Flowsheet Row ED from 11/19/2023 in [facility] Most recent reading at 11/21/2023 11:58 AM ED from 11/19/2023 in [facility] Most recent reading at 11/19/2023  5:23 PM ED from 01/09/2023 in [facility] Most recent reading at 01/09/2023  5:00 PM  C-SSRS RISK CATEGORY Low Risk No Risk No Risk        Musculoskeletal  Strength & Muscle Tone: within normal limits Gait & Station: normal Patient leans: N/A  Psychiatric Specialty Exam  Presentation  General Appearance:  Casual  Eye Contact: Fair  Speech: Clear and Coherent  Speech Volume: Normal  Handedness: Right   Mood and Affect  Mood: Anxious; Depressed  Affect: Depressed   Thought Process  Thought Processes: Coherent  Descriptions of Associations:Intact  Orientation:Partial  Thought Content:Logical  Diagnosis of Schizophrenia or Schizoaffective disorder in past: No    Hallucinations:Hallucinations: None  Ideas of Reference:None  Suicidal Thoughts:Suicidal Thoughts: No  Homicidal Thoughts:Homicidal Thoughts: No   Sensorium  Memory: Immediate Fair  Judgment: Fair  Insight: Fair   Art therapist  Concentration: Fair  Attention Span: Fair  Recall: Fair  Fund of  Knowledge: Fair  Language: Fair   Psychomotor Activity  Psychomotor Activity:Psychomotor Activity: Normal   Assets  Assets: Resilience   Sleep  Sleep:Sleep: Fair   No data recorded  Physical Exam  Physical Exam Vitals reviewed.  HENT:     Head: Normocephalic.  Eyes:     Pupils: Pupils are equal, round, and reactive to light.  Musculoskeletal:        General: Normal range of motion.     Cervical back: Normal range of motion.  Neurological:     General: No focal deficit present.     Mental Status: She is oriented to person, place, and time.    Review of Systems  Constitutional: Negative.   HENT: Negative.    Eyes: Negative.   Respiratory: Negative.    Cardiovascular: Negative.   Genitourinary: Negative.   Musculoskeletal: Negative.   Skin: Negative.   Psychiatric/Behavioral:  Positive for depression and substance abuse. Negative for hallucinations, memory loss and suicidal ideas. The patient is nervous/anxious and has insomnia.   All other systems reviewed and are negative.  Blood pressure 112/85, pulse 85, temperature 98.1 F (36.7 C), temperature source Oral, resp. rate 17, SpO2 98%. There is no height or weight on file to calculate BMI.  Treatment Plan Summary: Daily contact with patient to assess and evaluate symptoms and progress in treatment and Medication management -Please see the [facility] for complete medications list. -Continue Cymbalta  30 mg BID for depressive symptoms/pain -Continue Gabapentin  300 mg BID for FAD -Continue  Trazodone 50 mg nightly for sleep  -Monitor for signs of withdrawal -Continue Ativan  1mg  tabs every 6 hours PRN for CIWA >10 (recent scores 0,0) - Oral thiamine  and MVI replacement - Abstinence from substances encouraged  - SW to look into options for outpatient SA treatment at discharge  -Continue Q15 minute safety checks  -Robet Chiquito, NP 11/23/2023 7:13 PM

## 2023-11-23 NOTE — ED Notes (Signed)
 Patient is sleeping. Respirations equal and unlabored, skin warm and dry. No change in assessment or acuity. Routine safety checks conducted according to facility protocol.

## 2023-11-23 NOTE — Group Note (Signed)
 Group Topic: Healthy Self Image and Positive Change  Group Date: 11/23/2023 Start Time: 1210 End Time: 1240 Facilitators: Erma Hay, NT  Department: [facility]  Number of Participants: 9  Group Focus: community group Treatment Modality:  Patient-Centered Therapy Interventions utilized were group exercise Purpose: express feelings and increase insight  Name: Quincee Gittens Date of Birth: 1965-12-04  MR: [id number]    Level of Participation: Patient did not attend. Quality of Participation:  Interactions with others:  Mood/Affect:  Triggers (if applicable):  Cognition:  Progress:  Response:  Plan:   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/2023   Alcohol-induced mood disorder (HCC) 01/09/2023   Alcohol use disorder 01/09/2023   Other emphysema (HCC) 11/21/2022   Aortic atherosclerosis (HCC) 11/21/2022   Right lower lobe pulmonary nodule 11/21/2022   GAD (generalized anxiety disorder) 11/21/2022   Bipolar disorder, in full remission, most recent episode mixed (HCC) 11/21/2022   Tobacco abuse 11/21/2022   Alcohol abuse 11/21/2022   Cocaine abuse (HCC) 11/21/2022   Methamphetamine abuse (HCC) 11/21/2022   Acute hypoxemic respiratory failure (HCC) 07/08/2021   Methamphetamine use disorder, severe, dependence (HCC) 07/08/2021   Opioid overdose (HCC) 07/08/2021   Respiratory failure (HCC) 07/08/2021

## 2023-11-23 NOTE — ED Notes (Addendum)
 Pt is mostly isolative to her bedroom. She had snack after taking her medications. Pt denies SI/HI/AVH. Pt c/o anxiety and depression. Pt is med complaint. Pt is safe on the unit at this time.

## 2023-11-24 DIAGNOSIS — F322 Major depressive disorder, single episode, severe without psychotic features: Secondary | ICD-10-CM | POA: Diagnosis not present

## 2023-11-24 DIAGNOSIS — F411 Generalized anxiety disorder: Secondary | ICD-10-CM | POA: Diagnosis not present

## 2023-11-24 DIAGNOSIS — F1020 Alcohol dependence, uncomplicated: Secondary | ICD-10-CM | POA: Diagnosis not present

## 2023-11-24 DIAGNOSIS — F1421 Cocaine dependence, in remission: Secondary | ICD-10-CM | POA: Diagnosis not present

## 2023-11-24 MED ORDER — NALTREXONE HCL 50 MG PO TABS
50.0000 mg | ORAL_TABLET | Freq: Every day | ORAL | Status: DC
  Administered 2023-11-25: 50 mg via ORAL
  Filled 2023-11-24: qty 14
  Filled 2023-11-24: qty 1

## 2023-11-24 MED ORDER — NALTREXONE HCL 50 MG PO TABS
25.0000 mg | ORAL_TABLET | Freq: Every day | ORAL | Status: AC
  Administered 2023-11-24: 25 mg via ORAL
  Filled 2023-11-24: qty 1

## 2023-11-24 MED ORDER — OLANZAPINE 5 MG PO TABS
5.0000 mg | ORAL_TABLET | Freq: Every day | ORAL | Status: DC
  Administered 2023-11-24 – 2023-11-25 (×2): 5 mg via ORAL
  Filled 2023-11-24: qty 14
  Filled 2023-11-24 (×2): qty 1

## 2023-11-24 MED ORDER — HYDROXYZINE HCL 25 MG PO TABS
25.0000 mg | ORAL_TABLET | Freq: Three times a day (TID) | ORAL | Status: DC | PRN
  Administered 2023-11-25: 25 mg via ORAL
  Filled 2023-11-24: qty 1

## 2023-11-24 NOTE — ED Notes (Signed)
 Patient isolative to her room and bed.  No distress or withdrawal.  Will monitor.

## 2023-11-24 NOTE — ED Provider Notes (Signed)
 Behavioral Health Progress Note  Date and Time: 11/24/2023 1:35 PM Name: Lauren Blackwell MRN:  [id number]  HPI: Lauren Blackwell is a 58 y.o. female with PMH of MDD, GAD, PTSD AUD, StimUD (crack), who presented to the [facility] on 11/19/2023 with complaints of worsening depressive symptoms in the context of her recent relapse on alcohol and "crack" cocaine after 10 months of sobriety while residing & working at a [employer]). Pt requested assistance with detox and of alcohol, and with regaining her sobriety, and was admitted into the facility based crisis of the [facility] in hopes of stabilizing her mental health status & regaining entrance into a sober living arrangement. She's going to [facility] 11/26/2023, door-to-door.  Subjective: Patient was initially seen in bed awake in the dark mid-morning, pleasant and engaged.   She feels "ok, tired". Appetite intact and sleep is ok. There is some tossing and turning but the meds help. Denied med side effects thus far. Denied craving currently. Discussed naltrexone  and she has been on it in the past and wants to restart it. Denied having side effects to it in the past. Denied opioids. Amenable to restarting naltrexone  after discussing the risk, benefits, side effects.  Zyprexa  she takes outpatient, is the pill, not the odt. She's ok with me switching it back. Stated that the zyprexa  is for the voices in her head that she gets with she is using crack. Denied having voices when she isn't using. She's ok with me decreasing the dose some. Denied cravings. No somatic concerns.  Still plans on going to rehab.  Denied active and passive SI, HI, AVH  Discharge date for patient is 6/09, and she will be going to [facility] rehabilitation services on that day. She verbalizes readiness to go on that day.  Labs reviewed: Orders placed for vitamin B12, and vitamin D .  Diagnosis:  Final  diagnoses:  None    Total Time spent with patient: 30 minutes  Sleep: see subjective  Appetite:  see subjective  Current Medications:  Current Facility-Administered Medications  Medication Dose Route Frequency Provider Last Rate Last Admin   acetaminophen  (TYLENOL ) tablet 650 mg  650 mg Oral Q6H PRN Olasunkanmi, Oluwatosin, NP       alum & mag hydroxide-simeth (MAALOX/MYLANTA) 200-200-20 MG/5ML suspension 30 mL  30 mL Oral Q4H PRN Olasunkanmi, Oluwatosin, NP       atorvastatin  (LIPITOR) tablet 20 mg  20 mg Oral Daily Olasunkanmi, Oluwatosin, NP   20 mg at 11/24/23 0932   haloperidol  (HALDOL ) tablet 5 mg  5 mg Oral TID PRN Olasunkanmi, Oluwatosin, NP       And   diphenhydrAMINE  (BENADRYL ) capsule 50 mg  50 mg Oral TID PRN Olasunkanmi, Oluwatosin, NP       haloperidol  lactate (HALDOL ) injection 5 mg  5 mg Intramuscular TID PRN Olasunkanmi, Oluwatosin, NP       And   diphenhydrAMINE  (BENADRYL ) injection 50 mg  50 mg Intramuscular TID PRN Olasunkanmi, Oluwatosin, NP       And   LORazepam  (ATIVAN ) injection 2 mg  2 mg Intramuscular TID PRN Olasunkanmi, Oluwatosin, NP       haloperidol  lactate (HALDOL ) injection 10 mg  10 mg Intramuscular TID PRN Olasunkanmi, Oluwatosin, NP       And   diphenhydrAMINE  (BENADRYL ) injection 50 mg  50 mg Intramuscular TID PRN Olasunkanmi, Oluwatosin, NP       And   LORazepam  (ATIVAN ) injection 2 mg  2 mg Intramuscular TID PRN Olasunkanmi, Oluwatosin, NP       DULoxetine  (CYMBALTA ) DR capsule 30 mg  30 mg Oral BID Nkwenti, Doris, NP   30 mg at 11/24/23 0932   fluticasone  furoate-vilanterol (BREO ELLIPTA ) 200-25 MCG/ACT 1 puff  1 puff Inhalation Daily Olasunkanmi, Oluwatosin, NP   1 puff at 11/24/23 5366   gabapentin  (NEURONTIN ) capsule 300 mg  300 mg Oral BID Olasunkanmi, Oluwatosin, NP   300 mg at 11/24/23 0932   magnesium  hydroxide (MILK OF MAGNESIA) suspension 30 mL  30 mL Oral Daily PRN Olasunkanmi, Oluwatosin, NP       multivitamin with minerals tablet 1  tablet  1 tablet Oral Daily Olasunkanmi, Oluwatosin, NP   1 tablet at 11/24/23 0932   naltrexone  (DEPADE) tablet 25 mg  25 mg Oral QHS Lynsey Ange, DO       Followed by   Cecily Cohen ON 11/25/2023] naltrexone  (DEPADE) tablet 50 mg  50 mg Oral QHS Necola Bluestein, DO       nicotine  (NICODERM CQ  - dosed in mg/24 hours) patch 21 mg  21 mg Transdermal Q0600 Olasunkanmi, Oluwatosin, NP   21 mg at 11/24/23 0615   OLANZapine  (ZYPREXA ) tablet 5 mg  5 mg Oral QHS Milyn Stapleton, DO       thiamine  (VITAMIN B1) tablet 100 mg  100 mg Oral Daily Olasunkanmi, Oluwatosin, NP   100 mg at 11/24/23 0933   traZODone  (DESYREL ) tablet 50 mg  50 mg Oral QHS Bethea, Terrence C, MD   50 mg at 11/23/23 2112   Current Outpatient Medications  Medication Sig Dispense Refill   albuterol  (VENTOLIN  HFA) 108 (90 Base) MCG/ACT inhaler Inhale 1-2 puffs into the lungs every 6 (six) hours as needed for wheezing or shortness of breath. 6.7 g 1   atorvastatin  (LIPITOR) 20 MG tablet Take 20 mg by mouth at bedtime.     budesonide -formoterol  (SYMBICORT ) 160-4.5 MCG/ACT inhaler Inhale 2 puffs into the lungs 2 (two) times daily. 1 each 1   cetirizine  (ZYRTEC  ALLERGY) 10 MG tablet Take 1 tablet (10 mg total) by mouth daily. 30 tablet 1   DULoxetine  (CYMBALTA ) 30 MG capsule Take 1 capsule (30 mg total) by mouth daily. (Patient taking differently: Take 30 mg by mouth 2 (two) times daily.) 30 capsule 0   gabapentin  (NEURONTIN ) 400 MG capsule Take 400 mg by mouth 3 (three) times daily.     hydrOXYzine  (ATARAX ) 25 MG tablet Take 12.5-25 mg by mouth 3 (three) times daily as needed for anxiety.     mirtazapine (REMERON) 15 MG tablet Take 22.5 mg by mouth at bedtime as needed (For sleep).     naltrexone  (DEPADE) 50 MG tablet Take 100 mg by mouth daily.     OLANZapine  (ZYPREXA ) 5 MG tablet Take 5 mg by mouth daily.      Labs  Lab Results:  Admission on 11/19/2023, Discharged on 11/19/2023  Component Date Value Ref Range Status   WBC 11/19/2023 6.3   4.0 - 10.5 K/uL Final   RBC 11/19/2023 4.92  3.87 - 5.11 MIL/uL Final   Hemoglobin 11/19/2023 15.4 (H)  12.0 - 15.0 g/dL Final   HCT 44/08/4740 44.4  36.0 - 46.0 % Final   MCV 11/19/2023 90.2  80.0 - 100.0 fL Final   MCH 11/19/2023 31.3  26.0 - 34.0 pg Final   MCHC 11/19/2023 34.7  30.0 - 36.0 g/dL Final   RDW 59/56/3875 13.4  11.5 - 15.5 % Final   Platelets 11/19/2023  338  150 - 400 K/uL Final   nRBC 11/19/2023 0.0  0.0 - 0.2 % Final   Neutrophils Relative % 11/19/2023 47  % Final   Neutro Abs 11/19/2023 3.0  1.7 - 7.7 K/uL Final   Lymphocytes Relative 11/19/2023 41  % Final   Lymphs Abs 11/19/2023 2.6  0.7 - 4.0 K/uL Final   Monocytes Relative 11/19/2023 9  % Final   Monocytes Absolute 11/19/2023 0.5  0.1 - 1.0 K/uL Final   Eosinophils Relative 11/19/2023 2  % Final   Eosinophils Absolute 11/19/2023 0.1  0.0 - 0.5 K/uL Final   Basophils Relative 11/19/2023 1  % Final   Basophils Absolute 11/19/2023 0.0  0.0 - 0.1 K/uL Final   Immature Granulocytes 11/19/2023 0  % Final   Abs Immature Granulocytes 11/19/2023 0.01  0.00 - 0.07 K/uL Final   Performed at [facility], [address] [place], [place] [address]   Sodium 11/19/2023 140  135 - 145 mmol/L Final   Potassium 11/19/2023 3.9  3.5 - 5.1 mmol/L Final   Chloride 11/19/2023 101  98 - 111 mmol/L Final   CO2 11/19/2023 28  22 - 32 mmol/L Final   Glucose, Bld 11/19/2023 122 (H)  70 - 99 mg/dL Final   Glucose reference range applies only to samples taken after fasting for at least 8 hours.   BUN 11/19/2023 7  6 - 20 mg/dL Final   Creatinine, Ser 11/19/2023 0.73  0.44 - 1.00 mg/dL Final   Calcium  11/19/2023 9.0  8.9 - 10.3 mg/dL Final   Total Protein 60/45/4098 5.8 (L)  6.5 - 8.1 g/dL Final   Albumin 11/91/4782 3.1 (L)  3.5 - 5.0 g/dL Final   AST 95/62/1308 20  15 - 41 U/L Final   ALT 11/19/2023 16  0 - 44 U/L Final   Alkaline Phosphatase 11/19/2023 90  38 - 126 U/L Final   Total Bilirubin 11/19/2023 0.4  0.0 - 1.2 mg/dL  Final   GFR, Estimated 11/19/2023 >60  >60 mL/min Final   Comment: (NOTE) Calculated using the CKD-EPI Creatinine Equation (2021)    Anion gap 11/19/2023 11  5 - 15 Final   Performed at [facility], [address], [place], [place] [address]   Alcohol, Ethyl (B) 11/19/2023 <15  <15 mg/dL Final   Comment: (NOTE) For medical purposes only. Performed at [facility], [address], [place], [place] [address]    Hgb A1c MFr Bld 11/19/2023 5.3  4.8 - 5.6 % Final   Comment: (NOTE) Diagnosis of Diabetes The following HbA1c ranges recommended by the American Diabetes Association (ADA) may be used as an aid in the diagnosis of diabetes mellitus.  Hemoglobin             Suggested A1C NGSP%              Diagnosis  <5.7                   Non Diabetic  5.7-6.4                Pre-Diabetic  >6.4                   Diabetic  <7.0                   Glycemic control for  adults with diabetes.     Mean Plasma Glucose 11/19/2023 105.41  mg/dL Final   Performed at [facility], [address], [place], [place] [address]   Cholesterol 11/19/2023 163  0 - 200 mg/dL Final   Triglycerides 86/57/8469 65  <150 mg/dL Final   HDL 62/95/2841 51  >40 mg/dL Final   Total CHOL/HDL Ratio 11/19/2023 3.2  RATIO Final   VLDL 11/19/2023 13  0 - 40 mg/dL Final   LDL Cholesterol 11/19/2023 99  0 - 99 mg/dL Final   Comment:        Total Cholesterol/HDL:CHD Risk Coronary Heart Disease Risk Table                     Men   Women  1/2 Average Risk   3.4   3.3  Average Risk       5.0   4.4  2 X Average Risk   9.6   7.1  3 X Average Risk  23.4   11.0        Use the calculated Patient Ratio above and the CHD Risk Table to determine the patient's CHD Risk.        ATP III CLASSIFICATION (LDL):  <100     mg/dL   Optimal  324-401  mg/dL   Near or Above                    Optimal  130-159  mg/dL   Borderline  027-253  mg/dL   High  >664     mg/dL   Very High Performed  at [facility], [address], [place], [place] [address]    TSH 11/19/2023 1.767  0.350 - 4.500 uIU/mL Final   Comment: Performed by a 3rd Generation assay with a functional sensitivity of <=0.01 uIU/mL. Performed at [facility], [address] [place], [place] [address]    POC Amphetamine UR 11/19/2023 None Detected  NONE DETECTED (Cut Off Level 1000 ng/mL) Final   POC Secobarbital (BAR) 11/19/2023 None Detected  NONE DETECTED (Cut Off Level 300 ng/mL) Final   POC Buprenorphine (BUP) 11/19/2023 None Detected  NONE DETECTED (Cut Off Level 10 ng/mL) Final   POC Oxazepam (BZO) 11/19/2023 None Detected  NONE DETECTED (Cut Off Level 300 ng/mL) Final   POC Cocaine UR 11/19/2023 Positive (A)  NONE DETECTED (Cut Off Level 300 ng/mL) Final   POC Methamphetamine UR 11/19/2023 None Detected  NONE DETECTED (Cut Off Level 1000 ng/mL) Final   POC Morphine 11/19/2023 None Detected  NONE DETECTED (Cut Off Level 300 ng/mL) Final   POC Methadone UR 11/19/2023 None Detected  NONE DETECTED (Cut Off Level 300 ng/mL) Final   POC Oxycodone UR 11/19/2023 None Detected  NONE DETECTED (Cut Off Level 100 ng/mL) Final   POC Marijuana UR 11/19/2023 None Detected  NONE DETECTED (Cut Off Level 50 ng/mL) Final    Blood Alcohol level:  Lab Results  Component Value Date   ETH <15 11/19/2023   ETH 45 (H) 01/09/2023    Metabolic Disorder Labs: Lab Results  Component Value Date   HGBA1C 5.3 11/19/2023   MPG 105.41 11/19/2023   No results found for: "PROLACTIN" Lab Results  Component Value Date   CHOL 163 11/19/2023   TRIG 65 11/19/2023   HDL 51 11/19/2023   CHOLHDL 3.2 11/19/2023   VLDL 13 11/19/2023   LDLCALC 99 11/19/2023   LDLCALC 102 (H) 01/10/2023  Therapeutic Lab Levels: No results found for: "LITHIUM" No results found for: "VALPROATE" No results found for: "CBMZ"  Physical Findings   AUDIT    Flowsheet Row ED from 01/09/2023 in [facility]   Alcohol Use Disorder Identification Test Final Score (AUDIT) 40      PHQ2-9    Flowsheet Row ED from 11/19/2023 in [facility] ED from 01/09/2023 in [facility]  PHQ-2 Total Score 4 6  PHQ-9 Total Score 12 23      Flowsheet Row ED from 11/19/2023 in [facility] Most recent reading at 11/21/2023 11:58 AM ED from 11/19/2023 in [facility] Most recent reading at 11/19/2023  5:23 PM ED from 01/09/2023 in [facility] Most recent reading at 01/09/2023  5:00 PM  C-SSRS RISK CATEGORY Low Risk No Risk No Risk        Musculoskeletal  Strength & Muscle Tone: within normal limits Gait & Station: normal Patient leans: N/A  Psychiatric Specialty Exam  Presentation  General Appearance:  Casual  Eye Contact: Fair  Speech: Clear and Coherent  Speech Volume: Normal  Handedness: Right   Mood and Affect  Mood: Anxious; Depressed  Affect: Depressed   Thought Process  Thought Processes: Coherent  Descriptions of Associations:Intact  Orientation:Partial  Thought Content:Logical  Diagnosis of Schizophrenia or Schizoaffective disorder in past: No    Hallucinations:Hallucinations: None  Ideas of Reference:None  Suicidal Thoughts:Suicidal Thoughts: No  Homicidal Thoughts:Homicidal Thoughts: No   Sensorium  Memory: Immediate Fair  Judgment: Fair  Insight: Fair   Art therapist  Concentration: Fair  Attention Span: Fair  Recall: Fair  Fund of Knowledge: Fair  Language: Fair   Psychomotor Activity  Psychomotor Activity:Psychomotor Activity: Normal   Assets  Assets: Resilience   Sleep  Sleep:Sleep: Fair   Physical Exam  Physical Exam Vitals reviewed.  HENT:     Head: Normocephalic.  Eyes:     Pupils: Pupils are equal, round, and reactive to light.  Musculoskeletal:        General: Normal  range of motion.     Cervical back: Normal range of motion.  Neurological:     General: No focal deficit present.     Mental Status: She is oriented to person, place, and time.    Review of Systems  Constitutional:  Positive for malaise/fatigue.  HENT:  Negative for congestion.   Respiratory:  Negative for shortness of breath.   Cardiovascular:  Negative for chest pain.  Gastrointestinal:  Negative for nausea and vomiting.  Neurological:  Negative for dizziness, tremors and headaches.   Blood pressure 122/81, pulse 90, temperature 98.4 F (36.9 C), temperature source Oral, resp. rate 17, SpO2 95%. There is no height or weight on file to calculate BMI.  Treatment Plan Summary: Daily contact with patient to assess and evaluate symptoms and progress in treatment and Medication management -Please see the [facility] for complete medications list. -Continue Cymbalta  30 mg BID for depressive symptoms/pain -Continue Gabapentin  300 mg BID for FAD -Continue Trazodone  50 mg nightly for sleep -started naltrexone  25 mg x1day then increase to 50 mh qPM  -decreased zyprexa  otd 10 mg to tablet 5 mg qPM   -Monitor for signs of withdrawal -Continue Ativan  1mg  tabs every 6 hours PRN for CIWA >10 (recent scores 0,0) - Oral thiamine  and MVI replacement - Abstinence from substances encouraged  - SW to look into options for outpatient SA  treatment at discharge  -Continue Q15 minute safety checks  -Curlie Sittner, DO 11/24/2023 1:35 PM

## 2023-11-24 NOTE — ED Notes (Signed)
 Patient resting quietly in bed with eyes closed with unlabored breathing. Q 15 minute safety checks remain in place.  Pt remains safe on the unit at this time.

## 2023-11-24 NOTE — Group Note (Signed)
 Group Topic: Fears and Unhealthy Coping Skills  Group Date: 11/24/2023 Start Time: 1030 End Time: 1100 Facilitators: Milan Alfred, NT MHT 2 Department: [facility]  Number of Participants: 1  Group Focus: coping skills Treatment Modality:  Behavior Modification Therapy Interventions utilized were patient education Purpose: explore maladaptive thinking  Name: Lauren Blackwell Date of Birth: 1965/07/25  MR: [id number]    Level of Participation: Patient did not attend group Quality of Participation: N/A Interactions with others: N/A Mood/Affect: N/A Triggers (if applicable): N/A Cognition: N/A Progress: N/A Response: N/A Plan: N/A  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/2023   Alcohol-induced mood disorder (HCC) 01/09/2023   Alcohol use disorder 01/09/2023   Other emphysema (HCC) 11/21/2022   Aortic atherosclerosis (HCC) 11/21/2022   Right lower lobe pulmonary nodule 11/21/2022   GAD (generalized anxiety disorder) 11/21/2022   Bipolar disorder, in full remission, most recent episode mixed (HCC) 11/21/2022   Tobacco abuse 11/21/2022   Alcohol abuse 11/21/2022   Cocaine abuse (HCC) 11/21/2022   Methamphetamine abuse (HCC) 11/21/2022   Acute hypoxemic respiratory failure (HCC) 07/08/2021   Methamphetamine use disorder, severe, dependence (HCC) 07/08/2021   Opioid overdose (HCC) 07/08/2021   Respiratory failure (HCC) 07/08/2021

## 2023-11-24 NOTE — ED Notes (Signed)
 Patient asleep in bed without issue or complaint.  No distress or withdrawal at this time.  Will monitor.

## 2023-11-24 NOTE — ED Notes (Signed)
 Patient is sleeping. Respirations equal and unlabored, skin warm and dry. No change in assessment or acuity. Routine safety checks conducted according to facility protocol.

## 2023-11-24 NOTE — Group Note (Signed)
 Group Topic: Relaxation  Group Date: 11/24/2023 Start Time: 1215 End Time: 1250 Facilitators: Erma Hay, NT  Department: [facility]  Number of Participants: 7  Group Focus: relaxation Treatment Modality:  Patient-Centered Therapy Interventions utilized were group exercise Purpose: reinforce self-care  Name: Lauren Blackwell Date of Birth: 1966/05/17  MR: [id number]    Level of Participation: active Quality of Participation: cooperative Interactions with others: gave feedback Mood/Affect: appropriate Triggers (if applicable):  Cognition: coherent/clear Progress: Moderate Response:  Plan: patient will be encouraged to engage in outdoor activities to promote relaxation, social interaction, and positive behavioral reinforcement in a natural setting.  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/2023   Alcohol-induced mood disorder (HCC) 01/09/2023   Alcohol use disorder 01/09/2023   Other emphysema (HCC) 11/21/2022   Aortic atherosclerosis (HCC) 11/21/2022   Right lower lobe pulmonary nodule 11/21/2022   GAD (generalized anxiety disorder) 11/21/2022   Bipolar disorder, in full remission, most recent episode mixed (HCC) 11/21/2022   Tobacco abuse 11/21/2022   Alcohol abuse 11/21/2022   Cocaine abuse (HCC) 11/21/2022   Methamphetamine abuse (HCC) 11/21/2022   Acute hypoxemic respiratory failure (HCC) 07/08/2021   Methamphetamine use disorder, severe, dependence (HCC) 07/08/2021   Opioid overdose (HCC) 07/08/2021   Respiratory failure (HCC) 07/08/2021

## 2023-11-25 ENCOUNTER — Encounter (HOSPITAL_COMMUNITY): Payer: Self-pay

## 2023-11-25 DIAGNOSIS — E7849 Other hyperlipidemia: Secondary | ICD-10-CM

## 2023-11-25 DIAGNOSIS — F1421 Cocaine dependence, in remission: Secondary | ICD-10-CM | POA: Diagnosis not present

## 2023-11-25 DIAGNOSIS — F411 Generalized anxiety disorder: Secondary | ICD-10-CM | POA: Diagnosis not present

## 2023-11-25 DIAGNOSIS — F4310 Post-traumatic stress disorder, unspecified: Secondary | ICD-10-CM | POA: Diagnosis present

## 2023-11-25 DIAGNOSIS — J449 Chronic obstructive pulmonary disease, unspecified: Secondary | ICD-10-CM

## 2023-11-25 DIAGNOSIS — F322 Major depressive disorder, single episode, severe without psychotic features: Secondary | ICD-10-CM | POA: Diagnosis not present

## 2023-11-25 DIAGNOSIS — F1020 Alcohol dependence, uncomplicated: Secondary | ICD-10-CM | POA: Diagnosis not present

## 2023-11-25 MED ORDER — ADULT MULTIVITAMIN W/MINERALS CH
1.0000 | ORAL_TABLET | Freq: Every day | ORAL | 0 refills | Status: AC
Start: 2023-11-25 — End: 2023-12-25

## 2023-11-25 MED ORDER — GABAPENTIN 300 MG PO CAPS: 300.0000 mg | ORAL_CAPSULE | Freq: Two times a day (BID) | ORAL | 0 refills | Status: AC

## 2023-11-25 MED ORDER — OLANZAPINE 5 MG PO TABS: 5.0000 mg | ORAL_TABLET | Freq: Every day | ORAL | 0 refills | Status: AC

## 2023-11-25 MED ORDER — TRAZODONE HCL 50 MG PO TABS: 25.0000 mg | ORAL_TABLET | Freq: Every evening | ORAL | 0 refills | Status: AC | PRN

## 2023-11-25 MED ORDER — HYDROXYZINE HCL 25 MG PO TABS: 25.0000 mg | ORAL_TABLET | Freq: Three times a day (TID) | ORAL | 0 refills | Status: DC | PRN

## 2023-11-25 MED ORDER — HYDROXYZINE HCL 25 MG PO TABS: 25.0000 mg | ORAL_TABLET | Freq: Three times a day (TID) | ORAL | 0 refills | Status: AC | PRN

## 2023-11-25 MED ORDER — ATORVASTATIN CALCIUM 20 MG PO TABS
20.0000 mg | ORAL_TABLET | Freq: Every day | ORAL | 0 refills | Status: AC
Start: 2023-11-25 — End: 2023-12-25

## 2023-11-25 MED ORDER — ALBUTEROL SULFATE HFA 108 (90 BASE) MCG/ACT IN AERS: 1.0000 | INHALATION_SPRAY | Freq: Four times a day (QID) | RESPIRATORY_TRACT | 0 refills | Status: AC | PRN

## 2023-11-25 MED ORDER — BUDESONIDE-FORMOTEROL FUMARATE 160-4.5 MCG/ACT IN AERO: 2.0000 | INHALATION_SPRAY | Freq: Two times a day (BID) | RESPIRATORY_TRACT | 0 refills | Status: AC

## 2023-11-25 MED ORDER — DULOXETINE HCL 30 MG PO CPEP: 30.0000 mg | ORAL_CAPSULE | Freq: Two times a day (BID) | ORAL | 0 refills | Status: AC

## 2023-11-25 MED ORDER — NICOTINE 21 MG/24HR TD PT24: 21.0000 mg | MEDICATED_PATCH | Freq: Every day | TRANSDERMAL | 0 refills | Status: AC

## 2023-11-25 MED ORDER — NALTREXONE HCL 50 MG PO TABS: 50.0000 mg | ORAL_TABLET | Freq: Every day | ORAL | 0 refills | Status: AC

## 2023-11-25 MED ORDER — VITAMIN B-1 100 MG PO TABS: 100.0000 mg | ORAL_TABLET | Freq: Every day | ORAL | 0 refills | Status: AC

## 2023-11-25 MED ORDER — TRAZODONE HCL 50 MG PO TABS: 50.0000 mg | ORAL_TABLET | Freq: Every day | ORAL | Status: DC

## 2023-11-25 NOTE — ED Notes (Signed)
 Patient is sleeping. Respirations equal and unlabored, skin warm and dry. No change in assessment or acuity. Routine safety checks conducted according to facility protocol.

## 2023-11-25 NOTE — ED Notes (Signed)
 Patient is A&) x 4 with a euthymic expression and congruent mood. Patient endorses a positive anxiousness r/t entering into a treatment program. Patient states she is ready to start her life over positively. Patient denies SI, HI, AVH and does not appear to be responsive to internal stimuli. Supportive listening provided.

## 2023-11-25 NOTE — ED Notes (Signed)
 Patient reported to nurses station requesting medication for anxiousness. When asked what changed, reports she continues to have an excited positive anxiousness, her body just doesn't recognize it.

## 2023-11-25 NOTE — Group Note (Signed)
 Group Topic: Understanding Self  Group Date: 11/25/2023 Start Time: 1655 End Time: 1705 Facilitators: Pennie Box, RN  Department: [facility]  Number of Participants: 9  Group Focus: feeling awareness/expression Treatment Modality:  Psychoeducation Interventions utilized were assignment and patient education Purpose: increase insight  Name: Lauren Blackwell Date of Birth: 20-Feb-1966  MR: [id number]    Level of Participation: minimal Quality of Participation: attentive Interactions with others: no verbal feedback provided Mood/Affect: flat Triggers (if applicable): n/a Cognition: coherent/clear Progress: Minimal Response: no verbal feedback provided Plan: patient will be encouraged to attend future nursing groups  Patients Problems:  Patient Active Problem List   Diagnosis Date Noted   PTSD (post-traumatic stress disorder) 11/25/2023   Chronic obstructive pulmonary disease (HCC) 11/25/2023   Other hyperlipidemia 11/25/2023   Alcohol use disorder, severe, dependence (HCC) 11/20/2023   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/2023   Alcohol-induced mood disorder (HCC) 01/09/2023   Alcohol use disorder 01/09/2023   Other emphysema (HCC) 11/21/2022   Aortic atherosclerosis (HCC) 11/21/2022   Right lower lobe pulmonary nodule 11/21/2022   GAD (generalized anxiety disorder) 11/21/2022   MDD (major depressive disorder), severe (HCC) 11/21/2022   Tobacco abuse 11/21/2022   Alcohol abuse 11/21/2022   Cocaine use disorder, severe, dependence (HCC) 11/21/2022   Acute hypoxemic respiratory failure (HCC) 07/08/2021   Methamphetamine use disorder, severe, dependence (HCC) 07/08/2021   Opioid overdose (HCC) 07/08/2021   Respiratory failure (HCC) 07/08/2021
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
## 2023-11-25 NOTE — Group Note (Signed)
 Group Topic: Decisional Balance/Substance Abuse  Group Date: 11/25/2023 Start Time: 1930 End Time: 2000 Facilitators: Alvino Joseph, NT  Department: [facility]  Number of Participants: 9  Group Focus: coping skills Treatment Modality:  Individual Therapy Interventions utilized were group exercise Purpose: express feelings  Name: Lauren Blackwell Date of Birth: February 22, 1966  MR: [id number]    Level of Participation: active Quality of Participation: cooperative Interactions with others: gave feedback Mood/Affect: appropriate Triggers (if applicable): bowling,bar Cognition: coherent/clear Progress: Moderate Response: talked about triggers and wanting to get clean from drugs Plan: follow-up needed  Patients Problems:  Patient Active Problem List   Diagnosis Date Noted   PTSD (post-traumatic stress disorder) 11/25/2023   Chronic obstructive pulmonary disease (HCC) 11/25/2023   Other hyperlipidemia 11/25/2023   Alcohol use disorder, severe, dependence (HCC) 11/20/2023   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/2023   Alcohol-induced mood disorder (HCC) 01/09/2023   Alcohol use disorder 01/09/2023   Other emphysema (HCC) 11/21/2022   Aortic atherosclerosis (HCC) 11/21/2022   Right lower lobe pulmonary nodule 11/21/2022   GAD (generalized anxiety disorder) 11/21/2022   MDD (major depressive disorder), severe (HCC) 11/21/2022   Tobacco abuse 11/21/2022   Alcohol abuse 11/21/2022   Cocaine use disorder, severe, dependence (HCC) 11/21/2022   Acute hypoxemic respiratory failure (HCC) 07/08/2021   Methamphetamine use disorder, severe, dependence (HCC) 07/08/2021   Opioid overdose (HCC) 07/08/2021   Respiratory failure (HCC) 07/08/2021

## 2023-11-25 NOTE — Group Note (Signed)
 Group Topic: Positive Affirmations  Group Date: 11/25/2023 Start Time: 1610 End Time: 1700 Facilitators: Esther Hem, NT  Department: [facility]  Number of Participants: 9  Group Focus: affirmation Treatment Modality:  Psychoeducation Interventions utilized were support Purpose: increase insight and regain self-worth  Name: Lauren Blackwell Date of Birth: 1965-07-29  MR: [id number]    Level of Participation: active Quality of Participation: attentive, cooperative, and engaged Interactions with others: gave feedback Mood/Affect: appropriate Triggers (if applicable): n/a Cognition: coherent/clear Progress: Gaining insight Response: PT stated that she was tired of drugs and alcohol, wanted to see what the other side is all about. PT stated that she was in denial for years (regarding her substance abuse) but was ready to make a change. PT's positive affirmation was that she was a great partner. PT initially seemed confused but later agreed to it. Plan: patient will be encouraged to attend group.  Patients Problems:  Patient Active Problem List   Diagnosis Date Noted   PTSD (post-traumatic stress disorder) 11/25/2023   Chronic obstructive pulmonary disease (HCC) 11/25/2023   Other hyperlipidemia 11/25/2023   Alcohol use disorder, severe, dependence (HCC) 11/20/2023   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/2023   Alcohol-induced mood disorder (HCC) 01/09/2023   Alcohol use disorder 01/09/2023   Other emphysema (HCC) 11/21/2022   Aortic atherosclerosis (HCC) 11/21/2022   Right lower lobe pulmonary nodule 11/21/2022   GAD (generalized anxiety disorder) 11/21/2022   MDD (major depressive disorder), severe (HCC) 11/21/2022   Tobacco abuse 11/21/2022   Alcohol abuse 11/21/2022   Cocaine use disorder, severe, dependence (HCC) 11/21/2022   Acute hypoxemic respiratory failure (HCC) 07/08/2021   Methamphetamine use disorder,  severe, dependence (HCC) 07/08/2021   Opioid overdose (HCC) 07/08/2021   Respiratory failure (HCC) 07/08/2021

## 2023-11-25 NOTE — ED Notes (Signed)
 Pt compliant with all hs medications. RN reviewed hs medications with pt. Pt denies concerns and complaints. Pt expressed readiness for discharge tomorrow.

## 2023-11-26 DIAGNOSIS — F1421 Cocaine dependence, in remission: Secondary | ICD-10-CM | POA: Diagnosis not present

## 2023-11-26 DIAGNOSIS — F1020 Alcohol dependence, uncomplicated: Secondary | ICD-10-CM | POA: Diagnosis not present

## 2023-11-26 DIAGNOSIS — F411 Generalized anxiety disorder: Secondary | ICD-10-CM | POA: Diagnosis not present

## 2023-11-26 DIAGNOSIS — F322 Major depressive disorder, single episode, severe without psychotic features: Secondary | ICD-10-CM | POA: Diagnosis not present

## 2023-11-26 NOTE — ED Notes (Signed)
 Patient alert & oriented x4. Denies intent to harm self or others when asked. Denies A/VH. Patient denies any physical complaints when asked. No acute distress noted. Patient set to discharge this am to [facility]. No complaints or concerns regarding this plan voiced at this time. Support and encouragement provided. Routine safety checks conducted per facility protocol. Encouraged patient to notify staff if any thoughts of harm towards self or others arise. Patient verbalizes understanding and agreement.

## 2023-11-26 NOTE — ED Notes (Signed)
 Patient discharged to [facility] per MD order. After Visit Summary (AVS) printed and given to patient, as well as printed prescriptions, samples, and a copy of her suicide safety plan. AVS reviewed with patient and all questions fully answered. Patient discharged in no acute distress, A& O x4 and ambulatory. Patient denied SI/HI, A/VH upon discharge. Patient verbalized understanding of all discharge instructions explained by staff, including follow up appointments, RX's and safety plan. Patient mood fair. Patient belongings returned to patient from locker #23 complete and intact. Patient escorted to back sallyport via staff for transport to destination. Safety maintained.

## 2023-11-26 NOTE — ED Notes (Signed)
 Pt sleeping at present, no distress noted.  Monitoring for safety.

## 2023-11-26 NOTE — ED Notes (Signed)
 [facility] called to arrange transport to [facility] at 8am.

## 2023-11-26 NOTE — Group Note (Signed)
 Group Topic: Relapse and Recovery  Group Date: 11/26/2023 Start Time: 0735 End Time: 0745 Facilitators: Audri Kozub, [place], RN  Department: [facility]  Number of Participants: 1  Group Focus: discharge education Treatment Modality:  Individual Therapy Interventions utilized were patient education Purpose: increase insight  Name: Lauren Blackwell Date of Birth: Jun 29, 1965  MR: [id number]    Level of Participation: moderate Quality of Participation: attentive, cooperative, and passive Interactions with others: gave feedback Mood/Affect: appropriate and bored Triggers (if applicable): None identified at this time Cognition: coherent/clear and logical Progress: Significant Response: Patient given AVS, discharge instructions reviewed with patient. No questions voiced at this time. Facility contact information reviewed in case of future questions. Suicide safety plan completed with staff assistance and reviewed. RX and admission to [facility] discussed. No questions or concerns voiced at this time. Plan: patient will be encouraged to continue rehab process at [facility], refer to suicide safety plan as needed, reach out for additional support as needed.  Patients Problems:  Patient Active Problem List   Diagnosis Date Noted   PTSD (post-traumatic stress disorder) 11/25/2023   Chronic obstructive pulmonary disease (HCC) 11/25/2023   Other hyperlipidemia 11/25/2023   Alcohol use disorder, severe, dependence (HCC) 11/20/2023   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/2023   Alcohol-induced mood disorder (HCC) 01/09/2023   Alcohol use disorder 01/09/2023   Other emphysema (HCC) 11/21/2022   Aortic atherosclerosis (HCC) 11/21/2022   Right lower lobe pulmonary nodule 11/21/2022   GAD (generalized anxiety disorder) 11/21/2022   MDD (major depressive disorder), severe (HCC) 11/21/2022   Tobacco abuse 11/21/2022   Alcohol abuse 11/21/2022   Cocaine  use disorder, severe, dependence (HCC) 11/21/2022   Acute hypoxemic respiratory failure (HCC) 07/08/2021   Methamphetamine use disorder, severe, dependence (HCC) 07/08/2021   Opioid overdose (HCC) 07/08/2021   Respiratory failure (HCC) 07/08/2021
